# Patient Record
Sex: Male | Born: 1955 | Race: White | Hispanic: No | Marital: Single | Smoking: Former smoker
Health system: Southern US, Community
[De-identification: ages and names within clinical notes are randomized; demographics above are authoritative.]

## PROBLEM LIST (undated history)

## (undated) DIAGNOSIS — N529 Male erectile dysfunction, unspecified: Secondary | ICD-10-CM

## (undated) DIAGNOSIS — F32A Depression, unspecified: Secondary | ICD-10-CM

## (undated) DIAGNOSIS — F329 Major depressive disorder, single episode, unspecified: Secondary | ICD-10-CM

## (undated) DIAGNOSIS — E785 Hyperlipidemia, unspecified: Secondary | ICD-10-CM

## (undated) DIAGNOSIS — R0789 Other chest pain: Secondary | ICD-10-CM

## (undated) DIAGNOSIS — E119 Type 2 diabetes mellitus without complications: Secondary | ICD-10-CM

## (undated) DIAGNOSIS — K5790 Diverticulosis of intestine, part unspecified, without perforation or abscess without bleeding: Secondary | ICD-10-CM

## (undated) DIAGNOSIS — F419 Anxiety disorder, unspecified: Secondary | ICD-10-CM

## (undated) DIAGNOSIS — I10 Essential (primary) hypertension: Secondary | ICD-10-CM

## (undated) DIAGNOSIS — E669 Obesity, unspecified: Secondary | ICD-10-CM

## (undated) DIAGNOSIS — K219 Gastro-esophageal reflux disease without esophagitis: Secondary | ICD-10-CM

## (undated) DIAGNOSIS — R9431 Abnormal electrocardiogram [ECG] [EKG]: Secondary | ICD-10-CM

## (undated) HISTORY — DX: Essential (primary) hypertension: I10

## (undated) HISTORY — DX: Hyperlipidemia, unspecified: E78.5

## (undated) HISTORY — DX: Other chest pain: R07.89

## (undated) HISTORY — DX: Abnormal electrocardiogram (ECG) (EKG): R94.31

## (undated) HISTORY — DX: Obesity, unspecified: E66.9

## (undated) HISTORY — DX: Male erectile dysfunction, unspecified: N52.9

## (undated) HISTORY — DX: Type 2 diabetes mellitus without complications: E11.9

---

## 1999-03-11 ENCOUNTER — Emergency Department (HOSPITAL_COMMUNITY): Admission: EM | Admit: 1999-03-11 | Discharge: 1999-03-11 | Payer: Self-pay | Admitting: Emergency Medicine

## 2005-04-12 ENCOUNTER — Emergency Department (HOSPITAL_COMMUNITY): Admission: EM | Admit: 2005-04-12 | Discharge: 2005-04-12 | Payer: Self-pay | Admitting: Emergency Medicine

## 2005-07-21 HISTORY — PX: OTHER SURGICAL HISTORY: SHX169

## 2005-08-29 ENCOUNTER — Ambulatory Visit (HOSPITAL_COMMUNITY): Admission: RE | Admit: 2005-08-29 | Discharge: 2005-08-29 | Payer: Self-pay | Admitting: Family Medicine

## 2005-08-29 ENCOUNTER — Inpatient Hospital Stay (HOSPITAL_COMMUNITY): Admission: EM | Admit: 2005-08-29 | Discharge: 2005-09-02 | Payer: Self-pay | Admitting: Family Medicine

## 2007-11-01 ENCOUNTER — Emergency Department (HOSPITAL_COMMUNITY): Admission: EM | Admit: 2007-11-01 | Discharge: 2007-11-01 | Payer: Self-pay | Admitting: Emergency Medicine

## 2010-12-06 NOTE — Discharge Summary (Signed)
Shawn Yu, Shawn Yu                ACCOUNT NO.:  [id number]   MEDICAL RECORD NO.:  [id number]          PATIENT TYPE:  INP   LOCATION:  5739                         FACILITY:  [facility]   PHYSICIAN:  Wilmon Arms. Corliss Skains, M.D. DATE OF BIRTH:  Sep 06, 1955   DATE OF ADMISSION:  08/29/2005  DATE OF DISCHARGE:  09/02/2005                                 DISCHARGE SUMMARY   PRIMARY CARE PHYSICIAN:  None.   CHIEF COMPLAINT AND REASON FOR ADMISSION:  Shawn Yu is a 55 year old  patient with a positive family medical history of diverticulosis and  diverticulitis and also several family members have had bowel perforations  who presented for treatment with a complaint of one week's worth of feeling  weak and generalized malaise.  He developed severe lower abdominal pain the  Wednesday prior to admission.  He presented to the urgent care on the date  of admission where CT did reveal a proximal perforated diverticulum, small  air, no abscess.  His white count was 23,000.  He had a T-max on the day of  presentation of 101.1.  He was subsequently sent over to [facility] for admission.   ADMISSION DIAGNOSES:  1.  Perforated diverticulum.  2.  Leukocytosis.  3.  Fever.  4.  Known history of dyslipidemia.  5.  Known history of tobacco abuse.   HOSPITAL COURSE:  Patient was admitted to the general medical floor where he  was made NPO status, started on IV fluids, started empirically on Cipro and  Flagyl IV and he was monitored clinically.  By the following day, his white  count was found 18,000.  He had no fever.  He was passing gas.  His lungs  were clear.  Patient had moderate left lower quadrant tenderness and mild  left lower quadrant guarding.  By September 01, 2005, patient had been  tolerating full liquid diet, white count was down to 9800.  Abdomen exam was  benign.  Patient was advanced to a regular diet and Cipro and Flagyl were  changed to p.o.  By September 02, 2005,  patient was deemed appropriate for  discharge.  Patient has received a nutrition and health care teaching  regarding diverticulitis.   In addition, there was some question as to whether there was a possible  right renal cyst seen on CT.  Patient thinks he has been worked up for this  in the past.  May need to rediscuss this once the patient establishes with  PCP or per Dr. Dixon Boos discretion and follow-up.   FINAL DISCHARGE DIAGNOSES:  1.  Diverticulitis with perforated diverticulum.  2.  Leukocytosis, resolved.  3.  Fever, resolved.  4.  Dyslipidemia.  5.  Tobacco abuse.   DISCHARGE MEDICATIONS:  1.  Cipro 500 p.o. b.i.d. for the next six days.  2.  Flagyl 500 mg t.i.d. for the next six days.  3.  Effexor as before.  4.  Lipitor as before.  5.  Vicodin 5/500 one to two tablets  every six hours as needed for pain.  6.  Benadryl 25  or 50 mg over-the-counter at bedtime as needed to help      sleep.   DIET:  Heart healthy with diverticulosis precautions.   Return to work on September 09, 2005.  Patient does have the ability to work  out of the home and pending his discretion he may do this.   ACTIVITY:  Increase activity slowly.  No other restrictions.  No wound care.   FOLLOW UP:  He needs to call Dr. Dixon Boos office at [phone] to be seen in  the next two weeks.      Allison L. Rondel Jumbo. Tsuei, M.D.  Electronically Signed    ALE/MEDQ  D:  09/02/2005  T:  09/02/2005  Job:  [id number]

## 2010-12-06 NOTE — H&P (Signed)
Shawn Yu, Shawn Yu                ACCOUNT NO.:  [id number]   MEDICAL RECORD NO.:  [id number]          PATIENT TYPE:  INP   LOCATION:  5739                         FACILITY:  [facility]   PHYSICIAN:  Cherylynn Ridges, M.D.    DATE OF BIRTH:  03-15-1956   DATE OF ADMISSION:  08/29/2005  DATE OF DISCHARGE:  08/29/2005                                HISTORY & PHYSICAL   IDENTIFICATION/CHIEF COMPLAINT:  The patient is a 55 year old with acute  diverticulitis and micro perforation who is being admitted for IV  antibiotics.   HISTORY OF PRESENT ILLNESS:  The patient has been having worsening abdominal  pain for the last three days.  Actually, today it started to feel better as  he went to get a CT scan which showed acute diverticulitis with micro  perforation.  He has a family history of diverticular disease and  perforations but no personal problems with this before.  He comes in now for  IV antibiotics.  He was sent over from urgent care at [facility]  with the CT findings.   PAST MEDICAL HISTORY:  1.  Hypercholesterolemia/dyslipidemia.  2.  Anxiety disorder.   SOCIAL HISTORY:  He smoked five cigarettes a day and says he drinks a six-  pack of alcohol a day.   He has no known drug allergies.   He has had no previous surgery.   MEDICATIONS:  Include Lipitor and Effexor.   PHYSICAL EXAMINATION:  GENERAL:  He is well nourished, well developed  gentleman in no acute distress.  He says his abdomen is uncomfortable.  VITAL SIGNS:  He has had a fever up to 101.1.  It is currently 99.8.  His  pulse is 108, blood pressure 122/77.  HEENT:  He is normocephalic and atraumatic and anicteric.  NECK:  Supple.  No palpable adenopathy.  CHEST:  Clear to auscultation and percussion.  CARDIAC:  Regular rhythm and rate with no murmurs, gallops, rubs, or heaves.  ABDOMEN:  Soft without acute peritonitis.  He has hypoactive but present  bowel sounds.  No rebound or guarding.  No palpable masses.  RECTAL:  Not performed.   LABORATORY STUDIES:  Show a white count of 23.1 thousand, hemoglobin of 14,  hematocrit of 41.4, platelets of 281,000, and he does have a left shift.  Electrolytes are all within normal limits.   IMPRESSION:  Acute diverticulitis with micro perforation.   PLAN:  Treat him with IV antibiotics and hopefully he will get better  without need for acute operative intervention.  He will go to surgery if his  symptoms should worsen or his white count appears to be get worse.      Cherylynn Ridges, M.D.  Electronically Signed     JOW/MEDQ  D:  08/29/2005  T:  08/30/2005  Job:  [id number]

## 2012-11-17 ENCOUNTER — Other Ambulatory Visit (HOSPITAL_COMMUNITY): Payer: Self-pay | Admitting: Cardiovascular Disease

## 2012-11-17 DIAGNOSIS — R9431 Abnormal electrocardiogram [ECG] [EKG]: Secondary | ICD-10-CM

## 2012-11-17 DIAGNOSIS — R079 Chest pain, unspecified: Secondary | ICD-10-CM

## 2012-11-20 ENCOUNTER — Encounter: Payer: Self-pay | Admitting: *Deleted

## 2012-11-23 ENCOUNTER — Encounter: Payer: Self-pay | Admitting: Cardiovascular Disease

## 2012-12-02 ENCOUNTER — Encounter (HOSPITAL_COMMUNITY): Payer: Self-pay

## 2012-12-08 ENCOUNTER — Ambulatory Visit (HOSPITAL_COMMUNITY)
Admission: RE | Admit: 2012-12-08 | Discharge: 2012-12-08 | Disposition: A | Discharge status: Home / Self Care | Payer: BC Managed Care – PPO | Source: Ambulatory Visit | Attending: Cardiovascular Disease | Admitting: Cardiovascular Disease

## 2012-12-08 DIAGNOSIS — I10 Essential (primary) hypertension: Secondary | ICD-10-CM | POA: Insufficient documentation

## 2012-12-08 DIAGNOSIS — Z8249 Family history of ischemic heart disease and other diseases of the circulatory system: Secondary | ICD-10-CM | POA: Insufficient documentation

## 2012-12-08 DIAGNOSIS — Z87891 Personal history of nicotine dependence: Secondary | ICD-10-CM | POA: Insufficient documentation

## 2012-12-08 DIAGNOSIS — R9431 Abnormal electrocardiogram [ECG] [EKG]: Secondary | ICD-10-CM

## 2012-12-08 DIAGNOSIS — R079 Chest pain, unspecified: Secondary | ICD-10-CM | POA: Insufficient documentation

## 2012-12-08 DIAGNOSIS — E119 Type 2 diabetes mellitus without complications: Secondary | ICD-10-CM | POA: Insufficient documentation

## 2012-12-08 DIAGNOSIS — R0602 Shortness of breath: Secondary | ICD-10-CM | POA: Insufficient documentation

## 2012-12-08 MED ORDER — TECHNETIUM TC 99M SESTAMIBI GENERIC - CARDIOLITE
10.7000 | Freq: Once | INTRAVENOUS | Status: AC | PRN
  Administered 2012-12-08: 11 via INTRAVENOUS

## 2012-12-08 MED ORDER — TECHNETIUM TC 99M SESTAMIBI GENERIC - CARDIOLITE
30.2000 | Freq: Once | INTRAVENOUS | Status: AC | PRN
  Administered 2012-12-08: 30.2 via INTRAVENOUS

## 2012-12-08 NOTE — Procedures (Addendum)
[facility] [address] [place] [address] [phone]  Cardiology Nuclear Med Study  USBALDO PANNONE is a 57 y.o. male     MRN : [id number]     DOB: 1955-10-23  Procedure Date: 12/08/2012  Nuclear Med Background Indication for Stress Test:  Evaluation for Ischemia and Abnormal EKG History:  no prior history reported by pt. Cardiac Risk Factors: Family History - CAD, History of Smoking, Hypertension, Lipids, NIDDM and Obesity  Symptoms:  Chest Pain and SOB   Nuclear Pre-Procedure Caffeine/Decaff Intake:  1:00am NPO After: 11AM   IV Site: R Hand  IV 0.9% NS with Angio Cath:  22g  Chest Size (in):  46"  IV Started by: Emmit Pomfret, RN  Height: 5\' 10"  (1.778 m)  Cup Size: n/a  BMI:  Body mass index is 30.42 kg/(m^2). Weight:  212 lb (96.163 kg)   Tech Comments:  N/A    Nuclear Med Study 1 or 2 day study: 1 day  Stress Test Type:  Stress  Order Authorizing Provider:  Thurmon Fair, MD   Resting Radionuclide: Technetium 17m Sestamibi  Resting Radionuclide Dose: 10.7 mCi   Stress Radionuclide:  Technetium 19m Sestamibi  Stress Radionuclide Dose: 30.2 mCi           Stress Protocol Rest HR: 93 Stress HR: 151  Rest BP: 134/91 Stress BP: 186/92  Exercise Time (min): 8:32 METS: 10.1   Predicted Max HR: 164 bpm % Max HR: 92.07 bpm Rate Pressure Product: 13244  Dose of Adenosine (mg):  n/a Dose of Lexiscan: n/a mg  Dose of Atropine (mg): n/a Dose of Dobutamine: n/a mcg/kg/min (at max HR)  Stress Test Technologist: Esperanza Sheets, CCT Nuclear Technologist: Gonzella Lex, CNMT   Rest Procedure:  Myocardial perfusion imaging was performed at rest 45 minutes following the intravenous administration of Technetium 81m Sestamibi. Stress Procedure:  The patient performed treadmill exercise using a Bruce  Protocol for 8:32 minutes. The patient stopped due to SOB and Leg Fatigue and denied any chest pain.  There were no  significant ST-T wave changes.  Technetium 62m Sestamibi was injected at peak exercise and myocardial perfusion imaging was performed after a brief delay.  Transient Ischemic Dilatation (Normal <1.22):  1.16 Lung/Heart Ratio (Normal <0.45):  0.30 QGS EDV:  83 ml QGS ESV:  32 ml LV Ejection Fraction: 61%  Signed by  Gonzella Lex, CNMT  PHYSICIAN INTERPRETATION:  Rest ECG: NSR - Normal EKG  Stress ECG: Insignificant upsloping ST segment depression., There are scattered PVCs. and PVCs also in couplets; At the initial stage of recovery, there was a run of at least 8 beats of what looks like Non-sustained Ventricular Tachycardia  QPS Raw Data Images:  Mild diaphragmatic attenuation.  Normal left ventricular size. Stress Images:  There is decreased uptake in the inferior wall consistent with diaphragmatic attenuation.  Rest Images:  There is decreased uptake in the inferior wall. Subtraction (SDS):  There is a fixed inferior defect that is most consistent with diaphragmatic attenuation.  Impression Exercise Capacity:  Good exercise capacity. BP Response:  Normal blood pressure response.  Clinical Symptoms:  Fatigue and shortness of breath; no palpitations or chest pain ECG Impression:  No significant ST segment change suggestive of ischemia. Insignificant upsloping ST segment depression.There are scattered PVCs that occurred in singles and couplets followed by a brief run of Non-sustained Ventricular Tachycardia in early recovery. PVCs continued sporadically during recovery. Comparison with Prior Nuclear Study:  No images to compare  LV Wall Motion:  NL LV Function; NL Wall Motion  Overall Impression:  Normal stress nuclear study. and Low risk stress nuclear study with mild diaphragmatic attenuation.Marland Kitchen  Marykay Lex, MD  12/08/2012 6:56 PM

## 2012-12-16 ENCOUNTER — Ambulatory Visit: Payer: Self-pay | Admitting: Cardiovascular Disease

## 2012-12-23 ENCOUNTER — Ambulatory Visit (INDEPENDENT_AMBULATORY_CARE_PROVIDER_SITE_OTHER): Payer: BC Managed Care – PPO | Admitting: Cardiovascular Disease

## 2012-12-23 VITALS — BP 146/80 | HR 96 | Resp 20 | Ht 70.0 in | Wt 211.0 lb

## 2012-12-23 DIAGNOSIS — E119 Type 2 diabetes mellitus without complications: Secondary | ICD-10-CM

## 2012-12-23 DIAGNOSIS — I10 Essential (primary) hypertension: Secondary | ICD-10-CM

## 2012-12-23 DIAGNOSIS — N529 Male erectile dysfunction, unspecified: Secondary | ICD-10-CM

## 2012-12-23 DIAGNOSIS — E785 Hyperlipidemia, unspecified: Secondary | ICD-10-CM

## 2012-12-23 DIAGNOSIS — R0789 Other chest pain: Secondary | ICD-10-CM

## 2012-12-23 DIAGNOSIS — R9431 Abnormal electrocardiogram [ECG] [EKG]: Secondary | ICD-10-CM

## 2012-12-23 NOTE — Progress Notes (Signed)
Patient ID: Shawn Yu, male   DOB: [birth year], 57 y.o.   MRN: [id number]      Reason for office visit Followup nuclear stress test  Shawn Yu returns in followup after undergoing a stress myocardial perfusion study. He has numerous coronary risk factors (high cholesterol, high blood pressure, type 2 diabetes mellitus, former smoker, family history of premature death from CAD). He had atypical chest pain and his electrogram showed new inverted T waves in the inferior leads. Despite this, to exercise without symptoms for 8 minutes and 32 seconds on the standardized Bruce protocol. The blood pressure response was hypertensive. He did not experience angina and there were no ST segment changes. Most importantly the nuclear images were normal. A brief run of nonsustained VT was seen in early recovery.   No Known Allergies  Current Outpatient Prescriptions  Medication Sig Dispense Refill  . aspirin 81 MG tablet Take 81 mg by mouth daily.      Marland Kitchen atorvastatin (LIPITOR) 20 MG tablet Take 20 mg by mouth daily.      Marland Kitchen loratadine (CLARITIN) 10 MG tablet Take 10 mg by mouth daily.      Marland Kitchen losartan (COZAAR) 50 MG tablet Take 50 mg by mouth daily.      . metFORMIN (GLUCOPHAGE) 500 MG tablet Take 500 mg by mouth 3 (three) times daily.       Marland Kitchen omeprazole (PRILOSEC) 20 MG capsule Take 20 mg by mouth daily.      . tadalafil (CIALIS) 10 MG tablet Take 10 mg by mouth daily as needed for erectile dysfunction.      Marland Kitchen zolpidem (AMBIEN) 10 MG tablet Take 10 mg by mouth at bedtime as needed for sleep.      . fluticasone (FLONASE) 50 MCG/ACT nasal spray Place 2 sprays into the nose daily.       No current facility-administered medications for this visit.    Past Medical History  Diagnosis Date  . Chest pain, atypical   . SOB (shortness of breath)   . Abnormal EKG   . DM (diabetes mellitus)   . Obesity   . Erectile dysfunction   . Hypertension     No past surgical history on file.  Family History    Problem Relation Age of Onset  . Hypertension Mother   . Hypertension Mother   . Diabetes Father   . Heart failure Father   . Hypertension Brother   . Hyperlipidemia Brother   . Cancer Brother   . Hyperlipidemia Brother   . HIV Brother     History   Social History  . Marital Status: Single    Spouse Name: N/A    Number of Children: N/A  . Years of Education: N/A   Occupational History  . Not on file.   Social History Main Topics  . Smoking status: Former Smoker    Types: Cigarettes  . Smokeless tobacco: Not on file  . Alcohol Use: Yes     Comment: occas  . Drug Use: Not on file  . Sexually Active: Not on file   Other Topics Concern  . Not on file   Social History Narrative  . No narrative on file    Review of systems: The patient specifically denies any chest pain at rest or with exertion, dyspnea at rest or with exertion, orthopnea, paroxysmal nocturnal dyspnea, syncope, palpitations, focal neurological deficits, intermittent claudication, lower extremity edema, unexplained weight gain, cough, hemoptysis or wheezing.  The patient also denies  abdominal pain, nausea, vomiting, dysphagia, diarrhea, constipation, polyuria, polydipsia, dysuria, hematuria, frequency, urgency, abnormal bleeding or bruising, fever, chills, unexpected weight changes, mood swings, change in skin or hair texture, change in voice quality, auditory or visual problems, allergic reactions or rashes, new musculoskeletal complaints other than usual "aches and pains". He has erectile dysfunction   PHYSICAL EXAM BP 146/80  Pulse 96  Resp 20  Ht 5\' 10"  (1.778 m)  Wt 95.709 kg (211 lb)  BMI 30.28 kg/m2  General: Alert, oriented x3, no distress Head: no evidence of trauma, PERRL, EOMI, no exophtalmos or lid lag, no myxedema, no xanthelasma; normal ears, nose and oropharynx Neck: normal jugular venous pulsations and no hepatojugular reflux; brisk carotid pulses without delay and no carotid  bruits Chest: clear to auscultation, no signs of consolidation by percussion or palpation, normal fremitus, symmetrical and full respiratory excursions Cardiovascular: normal position and quality of the apical impulse, regular rhythm, normal first and second heart sounds, no murmurs, rubs or gallops Abdomen: no tenderness or distention, no masses by palpation, no abnormal pulsatility or arterial bruits, normal bowel sounds, no hepatosplenomegaly Extremities: no clubbing, cyanosis or edema; 2+ radial, ulnar and brachial pulses bilaterally; 2+ right femoral, posterior tibial and dorsalis pedis pulses; 2+ left femoral, posterior tibial and dorsalis pedis pulses; no subclavian or femoral bruits Neurological: grossly nonfocal   ASSESSMENT AND PLAN Atypical chest pain - normal nuclear stress test May 2014 The symptoms have abated and I'm not sure that have a good explanation for them. I think the anxiety related to the unexpected death of his brother (REPORTEDLY the autopsy showed evidence of coronary disease) has a lot to do with his concern about possible CAD.  DM2 (diabetes mellitus, type 2) I think this is clearly weight related and that he would lose just a few pounds, maybe reduce his waist size 234 inches, would likely not require medication for diabetes mellitus. His most recent hemoglobin A1c was 6.9%  On metformin monotherapy.  Essential hypertension Good control. An angiotensin receptor blocker is indeed a good choice since also has diabetes mellitus  Abnormal electrocardiogram Inverted T waves in leads 2, 3 and aVF noted on his electrocardiogram from April 2014 (and new from previous tracings) represent the major impetuous behind ordering his nuclear perfusion study. It is possible that they represent signs of left ventricular hypertrophy but there are no voltage criteria for LVH.  A repeat lipid profile is to be performed in August with his primary care  physician  Shawn Silk, MD, [facility] [phone] office [phone] pager

## 2012-12-23 NOTE — Patient Instructions (Addendum)
Your physician discussed the importance of regular exercise and recommended that you start or continue a regular exercise program for good health. Your physician recommends that you schedule a follow-up appointment in: 6 months.

## 2012-12-26 ENCOUNTER — Encounter: Payer: Self-pay | Admitting: Cardiovascular Disease

## 2012-12-26 DIAGNOSIS — I10 Essential (primary) hypertension: Secondary | ICD-10-CM | POA: Insufficient documentation

## 2012-12-26 DIAGNOSIS — R9431 Abnormal electrocardiogram [ECG] [EKG]: Secondary | ICD-10-CM | POA: Insufficient documentation

## 2012-12-26 DIAGNOSIS — R0789 Other chest pain: Secondary | ICD-10-CM | POA: Insufficient documentation

## 2012-12-26 DIAGNOSIS — E119 Type 2 diabetes mellitus without complications: Secondary | ICD-10-CM | POA: Insufficient documentation

## 2012-12-26 DIAGNOSIS — N529 Male erectile dysfunction, unspecified: Secondary | ICD-10-CM | POA: Insufficient documentation

## 2012-12-26 DIAGNOSIS — E785 Hyperlipidemia, unspecified: Secondary | ICD-10-CM | POA: Insufficient documentation

## 2012-12-26 NOTE — Assessment & Plan Note (Signed)
Inverted T waves in leads 2, 3 and aVF noted on his electrocardiogram from April 2014 (and new from previous tracings) represent the major impetuous behind ordering his nuclear perfusion study. It is possible that they represent signs of left ventricular hypertrophy but there are no voltage criteria for LVH.

## 2012-12-26 NOTE — Assessment & Plan Note (Signed)
The symptoms have abated and I'm not sure that have a good explanation for them. I think the anxiety related to the unexpected death of his brother (REPORTEDLY the autopsy showed evidence of coronary disease) has a lot to do with his concern about possible CAD.

## 2012-12-26 NOTE — Assessment & Plan Note (Signed)
I think this is clearly weight related and that he would lose just a few pounds, maybe reduce his waist size 234 inches, would likely not require medication for diabetes mellitus. His most recent hemoglobin A1c was 6.9%  On metformin monotherapy.

## 2012-12-26 NOTE — Assessment & Plan Note (Signed)
Good control. An angiotensin receptor blocker is indeed a good choice since also has diabetes mellitus

## 2013-04-28 ENCOUNTER — Emergency Department (INDEPENDENT_AMBULATORY_CARE_PROVIDER_SITE_OTHER): Payer: BC Managed Care – PPO

## 2013-04-28 ENCOUNTER — Emergency Department (HOSPITAL_COMMUNITY)
Admission: EM | Admit: 2013-04-28 | Discharge: 2013-04-28 | Disposition: A | Discharge status: Home / Self Care | Payer: BC Managed Care – PPO | Source: Home / Self Care | Attending: Emergency Medicine | Admitting: Emergency Medicine

## 2013-04-28 ENCOUNTER — Encounter (HOSPITAL_COMMUNITY): Payer: Self-pay | Admitting: Emergency Medicine

## 2013-04-28 DIAGNOSIS — S92919A Unspecified fracture of unspecified toe(s), initial encounter for closed fracture: Secondary | ICD-10-CM

## 2013-04-28 DIAGNOSIS — S92411A Displaced fracture of proximal phalanx of right great toe, initial encounter for closed fracture: Secondary | ICD-10-CM

## 2013-04-28 MED ORDER — OXYCODONE-ACETAMINOPHEN 5-325 MG PO TABS: 1.0000 | ORAL_TABLET | ORAL | Status: DC | PRN

## 2013-04-28 NOTE — ED Notes (Signed)
Running barefoot Tuesday and tripped but did not fall.  C/o pain, swelling and discoloration to 2nd and 3rd toes of R foot.  Great toe is painful red and swollen.

## 2013-04-28 NOTE — ED Notes (Signed)
Triage interrupted by PA giving instructions.

## 2013-04-28 NOTE — ED Provider Notes (Signed)
CSN: [id number]     Arrival date & time 04/28/13  1825 History   None    Chief Complaint  Patient presents with  . Foot Injury   (Consider location/radiation/quality/duration/timing/severity/associated sxs/prior Treatment) HPI Comments: 57 year old male presents complaining of right foot pain. He was running for exercise without shoes on when he stubbed his foot into something. He has severe pain and had developed swelling by the next day. The pain is primarily in the great toe. The pain is not so bad when he first wakes up, but by the end of the day the pain is fairly unbearable. He says the pain is severe enough to make him feel sick to his stomach. Denies any other injury or numbness in the toe. No history of fractures. He has been icing it and applying heat with some relief of his symptoms   Past Medical History  Diagnosis Date  . Chest pain, atypical   . SOB (shortness of breath)   . Abnormal EKG   . DM (diabetes mellitus)   . Obesity   . Erectile dysfunction   . Hypertension    History reviewed. No pertinent past surgical history. Family History  Problem Relation Age of Onset  . Hypertension Mother   . Hypertension Mother   . Diabetes Father   . Heart failure Father   . Hypertension Brother   . Hyperlipidemia Brother   . Cancer Brother   . Hyperlipidemia Brother   . HIV Brother    History  Substance Use Topics  . Smoking status: Former Smoker    Types: Cigarettes    Quit date: 07/21/2006  . Smokeless tobacco: Not on file  . Alcohol Use: No    Review of Systems  Constitutional: Negative for fever, chills and fatigue.  HENT: Negative for sore throat.   Eyes: Negative for visual disturbance.  Respiratory: Negative for cough and shortness of breath.   Cardiovascular: Negative for chest pain, palpitations and leg swelling.  Gastrointestinal: Negative for nausea, vomiting, abdominal pain, diarrhea and constipation.  Genitourinary: Negative for dysuria, urgency,  frequency and hematuria.  Musculoskeletal: Positive for arthralgias and joint swelling.       See HPI  Skin: Negative for rash.  Neurological: Negative for dizziness, weakness and light-headedness.    Allergies  Review of patient's allergies indicates no known allergies.  Home Medications   Current Outpatient Rx  Name  Route  Sig  Dispense  Refill  . aspirin 81 MG tablet   Oral   Take 81 mg by mouth daily.         Marland Kitchen atorvastatin (LIPITOR) 20 MG tablet   Oral   Take 20 mg by mouth daily.         Marland Kitchen loratadine (CLARITIN) 10 MG tablet   Oral   Take 10 mg by mouth daily.         Marland Kitchen losartan (COZAAR) 50 MG tablet   Oral   Take 50 mg by mouth daily.         . metFORMIN (GLUCOPHAGE) 500 MG tablet   Oral   Take 500 mg by mouth 3 (three) times daily.          Marland Kitchen omeprazole (PRILOSEC) 20 MG capsule   Oral   Take 20 mg by mouth daily.         . tadalafil (CIALIS) 10 MG tablet   Oral   Take 10 mg by mouth daily as needed for erectile dysfunction.         Marland Kitchen  zolpidem (AMBIEN) 10 MG tablet   Oral   Take 10 mg by mouth at bedtime as needed for sleep.         . fluticasone (FLONASE) 50 MCG/ACT nasal spray   Nasal   Place 2 sprays into the nose daily.         Marland Kitchen oxyCODONE-acetaminophen (ROXICET) 5-325 MG per tablet   Oral   Take 1 tablet by mouth every 4 (four) hours as needed for pain.   20 tablet   0    BP 159/95  Pulse 84  Temp(Src) 98.6 F (37 C) (Oral)  Resp 18  SpO2 99% Physical Exam  Nursing note and vitals reviewed. Constitutional: He is oriented to person, place, and time. He appears well-developed and well-nourished. No distress.  HENT:  Head: Normocephalic.  Pulmonary/Chest: Effort normal. No respiratory distress.  Musculoskeletal:       Feet:  Neurological: He is alert and oriented to person, place, and time. Coordination normal.  Skin: Skin is warm and dry. No rash noted. He is not diaphoretic.  Psychiatric: He has a normal mood and  affect. Judgment normal.    ED Course  Procedures (including critical care time) Labs Review Labs Reviewed - No data to display Imaging Review Dg Foot Complete Right  04/28/2013   CLINICAL DATA:  Right foot pain and swelling following a fall.  EXAM: RIGHT FOOT COMPLETE - 3+ VIEW  COMPARISON:  None.  FINDINGS: Comminuted fracture of the 1st proximal phalanx extending into the medial aspect of the 1st IP joint. Minimal lateral displacement and mild dorsal angulation of the distal fragment. Mild at lateral spur formation on both sides of the 1st MTP joint. Mild posterior calcaneal spur formation.  IMPRESSION: 1. Comminuted 1st proximal phalanx fracture with extension into the medial aspect of the 1st IP joint. 2. Mild 1st MTP joint degenerative changes.   Electronically Signed   By: Gordan Payment M.D.   On: 04/28/2013 19:55      MDM   1. Closed displaced fracture of proximal phalanx of great toe, right, initial encounter    Discussed this with the orthopedic surgeon on call. Placing in a postop shoe. Discharge with pain control. Ice, elevation. Followup with orthopedics within one week.   Meds ordered this encounter  Medications  . oxyCODONE-acetaminophen (ROXICET) 5-325 MG per tablet    Sig: Take 1 tablet by mouth every 4 (four) hours as needed for pain.    Dispense:  20 tablet    Refill:  0    Order Specific Question:  Supervising Provider    Answer:  Lorenz Coaster, DAVID C [[id number]]       Graylon Good, PA-C 04/28/13 2055

## 2013-04-28 NOTE — ED Provider Notes (Signed)
Medical screening examination/treatment/procedure(s) were performed by non-physician practitioner and as supervising physician I was immediately available for consultation/collaboration.  Leslee Home, M.D.  Reuben Likes, MD 04/28/13 2120

## 2013-05-23 ENCOUNTER — Telehealth: Payer: Self-pay | Admitting: Internal Medicine

## 2013-05-23 NOTE — Telephone Encounter (Signed)
Left pt vm to return call in ref to np appt. °

## 2013-05-23 NOTE — Telephone Encounter (Signed)
S/W PT IN REF TO NP APPT. ON 06/01/13@1 :30 REFERRING DR HUSIAN DX-ELEVATED WBC MAILED NP PACKET

## 2013-05-23 NOTE — Telephone Encounter (Signed)
C/D 05/23/13 for appt. 06/01/13

## 2013-06-01 ENCOUNTER — Ambulatory Visit: Payer: BC Managed Care – PPO

## 2013-06-01 ENCOUNTER — Ambulatory Visit (HOSPITAL_BASED_OUTPATIENT_CLINIC_OR_DEPARTMENT_OTHER): Payer: BC Managed Care – PPO | Admitting: Internal Medicine

## 2013-06-01 ENCOUNTER — Other Ambulatory Visit (HOSPITAL_BASED_OUTPATIENT_CLINIC_OR_DEPARTMENT_OTHER): Payer: BC Managed Care – PPO | Admitting: Lab

## 2013-06-01 ENCOUNTER — Encounter: Payer: Self-pay | Admitting: Internal Medicine

## 2013-06-01 ENCOUNTER — Other Ambulatory Visit: Payer: Self-pay | Admitting: Internal Medicine

## 2013-06-01 VITALS — BP 138/88 | HR 82 | Temp 98.5°F | Resp 22 | Ht 70.0 in | Wt 206.5 lb

## 2013-06-01 DIAGNOSIS — D72829 Elevated white blood cell count, unspecified: Secondary | ICD-10-CM

## 2013-06-01 LAB — COMPREHENSIVE METABOLIC PANEL (CC13)
ALT: 34 U/L (ref 0–55)
Alkaline Phosphatase: 86 U/L (ref 40–150)
BUN: 16.3 mg/dL (ref 7.0–26.0)
Calcium: 9.4 mg/dL (ref 8.4–10.4)
Creatinine: 0.8 mg/dL (ref 0.7–1.3)
Glucose: 138 mg/dl (ref 70–140)
Potassium: 4.3 mEq/L (ref 3.5–5.1)
Sodium: 139 mEq/L (ref 136–145)
Total Protein: 7.5 g/dL (ref 6.4–8.3)

## 2013-06-01 LAB — CBC WITH DIFFERENTIAL/PLATELET
BASO%: 0.9 % (ref 0.0–2.0)
Basophils Absolute: 0.1 10*3/uL (ref 0.0–0.1)
HCT: 41.8 % (ref 38.4–49.9)
MONO#: 0.8 10*3/uL (ref 0.1–0.9)
MONO%: 7.8 % (ref 0.0–14.0)
NEUT#: 6.5 10*3/uL (ref 1.5–6.5)
RBC: 4.59 10*6/uL (ref 4.20–5.82)
WBC: 9.7 10*3/uL (ref 4.0–10.3)

## 2013-06-01 NOTE — Progress Notes (Signed)
Checked in new pt with no financial concerns. °

## 2013-06-01 NOTE — Patient Instructions (Signed)
Your leukocyte count is normal today. Followup visit with your primary care physician as previously scheduled.

## 2013-06-01 NOTE — Progress Notes (Signed)
[facility] [phone]   [phone]  CONSULT NOTE  REFERRING PHYSICIAN: Dr. Georgann Housekeeper  REASON FOR CONSULTATION:  57 years old white male with leukocytosis  HPI Shawn Yu is a 57 y.o. male with a past medical history significant for hypertension, dyslipidemia, depression, diverticulosis with one episode of diverticulitis 7 years ago, diabetes mellitus, GERD and chronic back pain. The patient was seen recently by his primary care physician Dr. Donette Larry and CBC performed on 04/19/2013 showed elevated white blood count of 12.9, with normal hemoglobin of 14.1 and hematocrit of 42.6% and normal platelets count of 312,000. Repeat CBC on 05/19/2013 showed persistent elevation of the white blood count to 13.6 with elevated absolute neutrophil count to 9700 and monocytes to 1000.  The uterus and can be referred the patient to me today for further evaluation and recommendation regarding his persistent leukocytosis. The patient denied having any recent infection except for infection a few weeks ago and broken toe. He denied having any recent use of steroids or hormonal treatment.  He intentionally lost around 10 pounds recently and has occasional night sweats. The patient also has problem with Ragweed allergy and this usually associated with increasing aches and pains. He denied having any significant chest pain, shortness breath, cough or hemoptysis. The patient denied having any nausea or vomiting. He denied having any palpable lymphadenopathy. He denied having any fever or chills. Family history significant for a father who is deceased at age 45 5 with coronary artery disease. Mother is still alive at age 26 and brother had prostate cancer. The patient is single and has no children. 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 He denied having any history of smoking but drinks alcohol occasionally and no history of drug abuse. HPI  Past  Medical History  Diagnosis Date  . Chest pain, atypical   . SOB (shortness of breath)   . Abnormal EKG   . DM (diabetes mellitus)   . Obesity   . Erectile dysfunction   . Hypertension     History reviewed. No pertinent past surgical history.  Family History  Problem Relation Age of Onset  . Hypertension Mother   . Hypertension Mother   . Diabetes Father   . Heart failure Father   . Hypertension Brother   . Hyperlipidemia Brother   . Cancer Brother   . Hyperlipidemia Brother   . HIV Brother     Social History History  Substance Use Topics  . Smoking status: Former Smoker    Types: Cigarettes    Quit date: 07/21/2006  . Smokeless tobacco: Not on file  . Alcohol Use: No    No Known Allergies  Current Outpatient Prescriptions  Medication Sig Dispense Refill  . aspirin 81 MG tablet Take 81 mg by mouth daily.      Marland Kitchen atorvastatin (LIPITOR) 20 MG tablet Take 20 mg by mouth daily.      . fluticasone (FLONASE) 50 MCG/ACT nasal spray Place 2 sprays into the nose daily.      Marland Kitchen loratadine (CLARITIN) 10 MG tablet Take 10 mg by mouth daily.      Marland Kitchen losartan (COZAAR) 50 MG tablet Take 50 mg by mouth daily.      . metFORMIN (GLUCOPHAGE) 500 MG tablet Take 500 mg by mouth 3 (three) times daily.       Marland Kitchen omeprazole (PRILOSEC) 20 MG capsule Take 20 mg by mouth daily.      Marland Kitchen  oxyCODONE-acetaminophen (ROXICET) 5-325 MG per tablet Take 1 tablet by mouth every 4 (four) hours as needed for pain.  20 tablet  0  . tadalafil (CIALIS) 10 MG tablet Take 10 mg by mouth daily as needed for erectile dysfunction.      Marland Kitchen zolpidem (AMBIEN) 10 MG tablet Take 10 mg by mouth at bedtime as needed for sleep.       No current facility-administered medications for this visit.    Review of Systems  Constitutional: negative Eyes: negative Ears, nose, mouth, throat, and face: negative Respiratory: negative Cardiovascular: negative Gastrointestinal: negative Genitourinary:negative Integument/breast:  negative Hematologic/lymphatic: negative Musculoskeletal:negative Neurological: negative Behavioral/Psych: negative Endocrine: negative Allergic/Immunologic: negative  Physical Exam  FAO:ZHYQM, healthy, no distress, well nourished, well developed and anxious SKIN: skin color, texture, turgor are normal, no rashes or significant lesions HEAD: Normocephalic, No masses, lesions, tenderness or abnormalities EYES: normal, PERRLA EARS: External ears normal, Canals clear OROPHARYNX:no exudate, no erythema and lips, buccal mucosa, and tongue normal  NECK: supple, no adenopathy, no JVD LYMPH:  no palpable lymphadenopathy, no hepatosplenomegaly LUNGS: clear to auscultation , and palpation HEART: regular rate & rhythm, no murmurs and no gallops ABDOMEN:abdomen soft, non-tender, normal bowel sounds and no masses or organomegaly BACK: Back symmetric, no curvature., No CVA tenderness EXTREMITIES:no joint deformities, effusion, or inflammation, no edema, no skin discoloration  NEURO: alert & oriented x 3 with fluent speech, no focal motor/sensory deficits  PERFORMANCE STATUS: ECOG 0  LABORATORY DATA: Lab Results  Component Value Date   WBC 9.7 06/01/2013   HGB 13.9 06/01/2013   HCT 41.8 06/01/2013   MCV 91.1 06/01/2013   PLT 338 06/01/2013      Chemistry      Component Value Date/Time   NA 139 06/01/2013 1341   K 4.3 06/01/2013 1341   CO2 25 06/01/2013 1341   BUN 16.3 06/01/2013 1341   CREATININE 0.8 06/01/2013 1341      Component Value Date/Time   CALCIUM 9.4 06/01/2013 1341   ALKPHOS 86 06/01/2013 1341   AST 21 06/01/2013 1341   ALT 34 06/01/2013 1341   BILITOT 0.34 06/01/2013 1341       RADIOGRAPHIC STUDIES: No results found.  ASSESSMENT: This is a very pleasant 57 years old white male who presented for evaluation of leukocytosis which is completely resolved at this point and most likely was reactive in nature secondary to infection and chronic sinusitis.  PLAN: I  have a lengthy discussion with the patient today about his condition. I discussed the lab result today with him and I recommended for him to continue routine observation and followup with his primary care physician. I don't see a need for any further investigation of his condition at this point unless it is persistent leukocytosis. I will see the patient on as needed basis at this point. He knows to contact me if he has any concerning hematologic issues.  The patient voices understanding of current disease status and treatment options and is in agreement with the current care plan.  All questions were answered. The patient knows to call the clinic with any problems, questions or concerns. We can certainly see the patient much sooner if necessary.  Thank you so much for allowing me to participate in the care of Shawn Yu. I will continue to follow up the patient with you and assist in his care.  I spent 40 minutes counseling the patient face to face. The total time spent in the appointment was 55 minutes.  Shawn Yu K. 06/01/2013, 2:50 PM

## 2013-06-30 ENCOUNTER — Encounter: Payer: Self-pay | Admitting: Internal Medicine

## 2013-11-30 ENCOUNTER — Ambulatory Visit: Payer: BC Managed Care – PPO | Admitting: Dietician

## 2014-02-23 ENCOUNTER — Encounter: Payer: BC Managed Care – PPO | Attending: Nurse Practitioner

## 2014-02-23 VITALS — Ht 70.0 in | Wt 211.0 lb

## 2014-02-23 DIAGNOSIS — Z713 Dietary counseling and surveillance: Secondary | ICD-10-CM | POA: Insufficient documentation

## 2014-02-23 DIAGNOSIS — E119 Type 2 diabetes mellitus without complications: Secondary | ICD-10-CM | POA: Diagnosis present

## 2014-02-23 NOTE — Progress Notes (Signed)
Patient was seen on 02/23/2014 for the first of a series of three diabetes self-management courses at [facility].  Patient Education Plan per assessed needs and concerns is to attend four course education program for Diabetes Self Management Education.  Current HbA1c: 7.2%  The following learning objectives were met by the patient during this class:  Describe diabetes  State some common risk factors for diabetes  Defines the role of glucose and insulin  Identifies type of diabetes and pathophysiology  Describe the relationship between diabetes and cardiovascular risk  State the members of the Healthcare Team  States the rationale for glucose monitoring  State when to test glucose  State their individual Target Range  State the importance of logging glucose readings  Describe how to interpret glucose readings  Identifies A1C target  Explain the correlation between A1c and eAG values  State symptoms and treatment of high blood glucose  State symptoms and treatment of low blood glucose  Explain proper technique for glucose testing  Identifies proper sharps disposal  Handouts given during class include:  Living Well with Diabetes book  Carb Counting and Meal Planning book  Meal Plan Card  Carbohydrate guide  Meal planning worksheet  Low Sodium Flavoring Tips  The diabetes portion plate  W3J to eAG Conversion Chart  Diabetes Medications  Diabetes Recommended Care Schedule  Support Group  Diabetes Success Plan  Core Class Satisfaction Survey  Follow-Up Plan:  Attend core 2

## 2014-03-02 DIAGNOSIS — E119 Type 2 diabetes mellitus without complications: Secondary | ICD-10-CM | POA: Diagnosis not present

## 2014-03-02 NOTE — Progress Notes (Signed)

## 2014-03-16 DIAGNOSIS — E119 Type 2 diabetes mellitus without complications: Secondary | ICD-10-CM

## 2014-03-20 NOTE — Progress Notes (Signed)
Patient was seen on 03/16/14 for the third of a series of three diabetes self-management courses at the [facility]. The following learning objectives were met by the patient during this class:    State the amount of activity recommended for healthy living   Describe activities suitable for individual needs   Identify ways to regularly incorporate activity into daily life   Identify barriers to activity and ways to over come these barriers  Identify diabetes medications being personally used and their primary action for lowering glucose and possible side effects   Describe role of stress on blood glucose and develop strategies to address psychosocial issues   Identify diabetes complications and ways to prevent them  Explain how to manage diabetes during illness   Evaluate success in meeting personal goal   Establish 2-3 goals that they will plan to diligently work on until they return for the  18-monthfollow-up visit  Goals:  Follow Diabetes Meal Plan as instructed  Aim for 15-30 mins of physical activity daily as tolerated  Bring food record and glucose log to your follow up visit  Your patient has established the following 4 month goals in their individualized success plan: I will count my carb choices at most meals and snacks I will increase my activity level at least 3 days a week I will take my diabetes medications as scheduled I will test my glucose at least 2-3 times a day, 7 days a week I will look at patterns in my record book at least daily To help manage stress I will exercise at least 3 times a week   Your patient has identified these potential barriers to change:  motivation  stress  Lack of family support  Your patient has identified their diabetes self-care support plan as  [facility]  On-Line resources  Plan:  Attend Core 4 in 4 months

## 2015-07-09 ENCOUNTER — Telehealth: Payer: Self-pay | Admitting: Internal Medicine

## 2015-07-09 NOTE — Telephone Encounter (Signed)
Patient referred by Dr. Lysle Rubens. Referral taken to desk nurse by HIM due to patient established. Last visit November 2014. Per desk nurse scheduled patient for f/u 2-3 weeks. Called patient but was not able to reach him due to he is at work. Gave relative appointment for 07/31/15 with MM. Schedule mailed.

## 2015-07-31 ENCOUNTER — Telehealth: Payer: Self-pay | Admitting: Internal Medicine

## 2015-07-31 ENCOUNTER — Encounter: Payer: Self-pay | Admitting: Internal Medicine

## 2015-07-31 ENCOUNTER — Ambulatory Visit (HOSPITAL_BASED_OUTPATIENT_CLINIC_OR_DEPARTMENT_OTHER): Payer: 59 | Admitting: Internal Medicine

## 2015-07-31 ENCOUNTER — Ambulatory Visit: Payer: Self-pay

## 2015-07-31 VITALS — BP 127/72 | HR 89 | Temp 98.3°F | Resp 17 | Ht 70.0 in | Wt 205.2 lb

## 2015-07-31 DIAGNOSIS — D72829 Elevated white blood cell count, unspecified: Secondary | ICD-10-CM | POA: Diagnosis not present

## 2015-07-31 LAB — COMPREHENSIVE METABOLIC PANEL
ALT: 25 U/L (ref 0–55)
AST: 18 U/L (ref 5–34)
Albumin: 4.2 g/dL (ref 3.5–5.0)
Alkaline Phosphatase: 73 U/L (ref 40–150)
Anion Gap: 10 mEq/L (ref 3–11)
BILIRUBIN TOTAL: 0.44 mg/dL (ref 0.20–1.20)
BUN: 23.1 mg/dL (ref 7.0–26.0)
CHLORIDE: 104 meq/L (ref 98–109)
CO2: 28 meq/L (ref 22–29)
CREATININE: 1 mg/dL (ref 0.7–1.3)
Calcium: 9.6 mg/dL (ref 8.4–10.4)
EGFR: 85 mL/min/{1.73_m2} — AB (ref >90)
GLUCOSE: 109 mg/dL (ref 70–140)
Potassium: 4.3 mEq/L (ref 3.5–5.1)
SODIUM: 142 meq/L (ref 136–145)
TOTAL PROTEIN: 7.1 g/dL (ref 6.4–8.3)

## 2015-07-31 LAB — CBC WITH DIFFERENTIAL/PLATELET
BASO%: 0.7 % (ref 0.0–2.0)
Basophils Absolute: 0.1 10*3/uL (ref 0.0–0.1)
EOS%: 1.1 % (ref 0.0–7.0)
Eosinophils Absolute: 0.2 10*3/uL (ref 0.0–0.5)
HCT: 38.9 % (ref 38.4–49.9)
HGB: 12.8 g/dL — ABNORMAL LOW (ref 13.0–17.1)
LYMPH%: 13.8 % — AB (ref 14.0–49.0)
MCH: 30.1 pg (ref 27.2–33.4)
MCHC: 32.8 g/dL (ref 32.0–36.0)
MCV: 91.5 fL (ref 79.3–98.0)
MONO#: 1.3 10*3/uL — AB (ref 0.1–0.9)
MONO%: 8 % (ref 0.0–14.0)
NEUT%: 76.4 % — AB (ref 39.0–75.0)
NEUTROS ABS: 12.8 10*3/uL — AB (ref 1.5–6.5)
Platelets: 329 10*3/uL (ref 140–400)
RBC: 4.25 10*6/uL (ref 4.20–5.82)
RDW: 13.8 % (ref 11.0–14.6)
WBC: 16.7 10*3/uL — AB (ref 4.0–10.3)
lymph#: 2.3 10*3/uL (ref 0.9–3.3)

## 2015-07-31 LAB — LACTATE DEHYDROGENASE: LDH: 176 U/L (ref 125–245)

## 2015-07-31 NOTE — Progress Notes (Signed)
[facility] [phone]   Fax:(336) [address] [place]  [phone]  DIAGNOSIS: Persistent leukocytosis.  PRIOR THERAPY: None  CURRENT THERAPY: Observation  INTERVAL HISTORY: Shawn Yu 60 y.o. male returns to the clinic today for follow-up visit based on the request of his primary care physician. The patient was seen more than 2 years ago for evaluation of his similar condition and repeat CBC at that time showed no concerning finding in his absolute neutrophil count were within the normal range. He was seen recently by his primary care physician for routine evaluation in his total white blood count was elevated on 2 separate occasions. He was referred back to me today for evaluation and recommendation regarding his condition. The patient mentions that he has a history of diverticulitis in the past and he also has been sick a few times over the last few months. He is not currently on any steroids or hormonal therapy. Teague or weakness. He denied having any chest pain, shortness breath, cough or hemoptysis. He has no weight loss or night sweats. He denied having any significant bleeding, bruises or ecchymosis.  MEDICAL HISTORY: Past Medical History  Diagnosis Date  . Chest pain, atypical   . SOB (shortness of breath)   . Abnormal EKG   . DM (diabetes mellitus) ([place])   . Obesity   . Erectile dysfunction   . Hypertension   . Hyperlipidemia     ALLERGIES:  has No Known Allergies.  MEDICATIONS:  Current Outpatient Prescriptions  Medication Sig Dispense Refill  . amlodipine-atorvastatin (CADUET) 10-20 MG tablet TK 1 T PO ONCE D  5  . aspirin 81 MG tablet Take 81 mg by mouth daily.    Marland Kitchen atorvastatin (LIPITOR) 20 MG tablet Take 20 mg by mouth daily.    . busPIRone (BUSPAR) 10 MG tablet TK 1 T PO BID  2  . estazolam (PROSOM) 2 MG tablet TK 1 T PO HS PRN  2  . FLUoxetine HCl 60 MG  TABS TK 1 T PO D  0  . fluticasone (FLONASE) 50 MCG/ACT nasal spray Place 2 sprays into the nose daily.    Marland Kitchen loratadine (CLARITIN) 10 MG tablet Take 10 mg by mouth daily.    Marland Kitchen losartan (COZAAR) 50 MG tablet Take 50 mg by mouth daily.    . metFORMIN (GLUCOPHAGE) 500 MG tablet Take 500 mg by mouth 3 (three) times daily.     Marland Kitchen omeprazole (PRILOSEC) 20 MG capsule Take 20 mg by mouth daily.    . ONE TOUCH ULTRA TEST test strip CHECK BLOOD SUGAR 3 TIMES A DAY.  0  . ONGLYZA 5 MG TABS tablet TK 1 T PO QD  11  . zolpidem (AMBIEN) 10 MG tablet Take 10 mg by mouth at bedtime as needed for sleep.     No current facility-administered medications for this visit.    SURGICAL HISTORY: History reviewed. No pertinent past surgical history.  REVIEW OF SYSTEMS:  A comprehensive review of systems was negative.   PHYSICAL EXAMINATION: General appearance: alert, cooperative and no distress Head: Normocephalic, without obvious abnormality, atraumatic Neck: no adenopathy, no JVD, supple, symmetrical, trachea midline and thyroid not enlarged, symmetric, no tenderness/mass/nodules Lymph nodes: Cervical, supraclavicular, and axillary nodes normal. Resp: clear to auscultation bilaterally Back: symmetric, no curvature. ROM normal. No CVA tenderness. Cardio: regular rate and rhythm, S1, S2 normal, no murmur, click, rub  or gallop GI: soft, non-tender; bowel sounds normal; no masses,  no organomegaly Extremities: extremities normal, atraumatic, no cyanosis or edema  ECOG PERFORMANCE STATUS: 0 - Asymptomatic  Blood pressure 127/72, pulse 89, temperature 98.3 F (36.8 C), temperature source Oral, resp. rate 17, height 5\' 10"  (1.778 m), weight 205 lb 3.2 oz (93.078 kg), SpO2 99 %.  LABORATORY DATA: Lab Results  Component Value Date   WBC 9.7 06/01/2013   HGB 13.9 06/01/2013   HCT 41.8 06/01/2013   MCV 91.1 06/01/2013   PLT 338 06/01/2013      Chemistry      Component Value Date/Time   NA 139 06/01/2013  1341   K 4.3 06/01/2013 1341   CO2 25 06/01/2013 1341   BUN 16.3 06/01/2013 1341   CREATININE 0.8 06/01/2013 1341      Component Value Date/Time   CALCIUM 9.4 06/01/2013 1341   ALKPHOS 86 06/01/2013 1341   AST 21 06/01/2013 1341   ALT 34 06/01/2013 1341   BILITOT 0.34 06/01/2013 1341       RADIOGRAPHIC STUDIES: No results found.  ASSESSMENT AND PLAN: This is a very pleasant 60 years old white male presented today for evaluation of persistent leukocytosis. I had a lengthy discussion with the patient today about his condition. He has several recent blood work that showed persistent leukocytosis. I will repeat his CBC, comprehensive metabolic panel and LDH today. I will also order molecular study for BCR/ABL to rule out any myeloproliferative disorder. If there is any concerning findings on his recent blood work I will call the patient for follow-up visit otherwise he will continue routine follow-up visit with his primary care physician at this point as this may be reactive in nature. The patient was advised to call immediately if he has any concerning symptoms in the interval.  The patient voices understanding of current disease status and treatment options and is in agreement with the current care plan.  All questions were answered. The patient knows to call the clinic with any problems, questions or concerns. We can certainly see the patient much sooner if necessary.  Disclaimer: This note was dictated with voice recognition software. Similar sounding words can inadvertently be transcribed and may not be corrected upon review.

## 2015-07-31 NOTE — Telephone Encounter (Signed)
Patient sent back to lab no f/u information at this time.

## 2015-08-06 ENCOUNTER — Telehealth: Payer: Self-pay | Admitting: Medical Oncology

## 2015-08-06 NOTE — Telephone Encounter (Signed)
Pt notified that per [facility] his " lab is ok and he can f/u with PCP or me in 3 months". Pt stated he will f/u with PCP.

## 2015-12-13 ENCOUNTER — Other Ambulatory Visit: Payer: Self-pay | Admitting: Gastroenterology

## 2016-01-11 ENCOUNTER — Encounter (HOSPITAL_COMMUNITY): Payer: Self-pay | Admitting: *Deleted

## 2016-01-20 ENCOUNTER — Encounter (HOSPITAL_COMMUNITY): Payer: Self-pay | Admitting: Certified Registered Nurse Anesthetist

## 2016-01-21 ENCOUNTER — Ambulatory Visit (HOSPITAL_COMMUNITY)
Admission: RE | Admit: 2016-01-21 | Discharge: 2016-01-21 | Disposition: A | Discharge status: Home / Self Care | Payer: 59 | Source: Ambulatory Visit | Attending: Gastroenterology | Admitting: Gastroenterology

## 2016-01-21 ENCOUNTER — Encounter (HOSPITAL_COMMUNITY): Payer: Self-pay | Admitting: *Deleted

## 2016-01-21 ENCOUNTER — Encounter (HOSPITAL_COMMUNITY)
Admission: RE | Disposition: A | Discharge status: Home / Self Care | Payer: Self-pay | Source: Ambulatory Visit | Attending: Gastroenterology

## 2016-01-21 DIAGNOSIS — Z539 Procedure and treatment not carried out, unspecified reason: Secondary | ICD-10-CM | POA: Diagnosis not present

## 2016-01-21 DIAGNOSIS — Z1211 Encounter for screening for malignant neoplasm of colon: Secondary | ICD-10-CM | POA: Insufficient documentation

## 2016-01-21 HISTORY — DX: Anxiety disorder, unspecified: F41.9

## 2016-01-21 HISTORY — DX: Depression, unspecified: F32.A

## 2016-01-21 HISTORY — DX: Major depressive disorder, single episode, unspecified: F32.9

## 2016-01-21 SURGERY — CANCELLED PROCEDURE
Anesthesia: Monitor Anesthesia Care

## 2016-01-21 MED ORDER — SODIUM CHLORIDE 0.9 % IV SOLN: INTRAVENOUS | Status: DC

## 2016-01-21 MED ORDER — FENTANYL CITRATE (PF) 100 MCG/2ML IJ SOLN: 25.0000 ug | INTRAMUSCULAR | Status: DC | PRN

## 2016-01-21 MED ORDER — LACTATED RINGERS IV SOLN: INTRAVENOUS | Status: DC

## 2016-01-21 SURGICAL SUPPLY — 21 items

## 2016-01-21 NOTE — Progress Notes (Signed)
During pre-procedure, pt stated he ate solid food yesterday at lunch.  Dr. Wynetta Emery ordered for procedure to be cancelled and to be rescheduled for later date.  Pt instructed to call office to reschedule colonoscopy.  Vista Lawman, RN

## 2016-01-21 NOTE — Anesthesia Preprocedure Evaluation (Deleted)
Anesthesia Evaluation  Patient identified by MRN, date of birth, ID band Patient awake    Reviewed: Allergy & Precautions, H&P , NPO status , Patient's Chart, lab work & pertinent test results  Airway Mallampati: II  TM Distance: >3 FB Neck ROM: full    Dental no notable dental hx. (+) Dental Advisory Given, Teeth Intact   Pulmonary neg pulmonary ROS, former smoker,    Pulmonary exam normal breath sounds clear to auscultation       Cardiovascular Exercise Tolerance: Good hypertension, Pt. on medications Normal cardiovascular exam Rhythm:regular Rate:Normal     Neuro/Psych negative neurological ROS  negative psych ROS   GI/Hepatic negative GI ROS, Neg liver ROS,   Endo/Other  diabetes, Well Controlled, Type 2, Oral Hypoglycemic Agents  Renal/GU negative Renal ROS  negative genitourinary   Musculoskeletal   Abdominal   Peds  Hematology negative hematology ROS (+)   Anesthesia Other Findings   Reproductive/Obstetrics negative OB ROS                             Anesthesia Physical Anesthesia Plan  ASA: III  Anesthesia Plan: MAC   Post-op Pain Management:    Induction:   Airway Management Planned:   Additional Equipment:   Intra-op Plan:   Post-operative Plan:   Informed Consent: I have reviewed the patients History and Physical, chart, labs and discussed the procedure including the risks, benefits and alternatives for the proposed anesthesia with the patient or authorized representative who has indicated his/her understanding and acceptance.   Dental Advisory Given  Plan Discussed with: CRNA  Anesthesia Plan Comments:         Anesthesia Quick Evaluation

## 2016-02-12 ENCOUNTER — Other Ambulatory Visit: Payer: Self-pay | Admitting: Gastroenterology

## 2016-02-15 ENCOUNTER — Encounter (HOSPITAL_COMMUNITY): Payer: Self-pay | Admitting: *Deleted

## 2016-02-19 ENCOUNTER — Encounter (HOSPITAL_COMMUNITY): Payer: Self-pay

## 2016-02-19 ENCOUNTER — Ambulatory Visit (HOSPITAL_COMMUNITY): Payer: 59 | Admitting: Certified Registered"

## 2016-02-19 ENCOUNTER — Ambulatory Visit (HOSPITAL_COMMUNITY)
Admission: RE | Admit: 2016-02-19 | Discharge: 2016-02-19 | Disposition: A | Discharge status: Home / Self Care | Payer: 59 | Source: Ambulatory Visit | Attending: Gastroenterology | Admitting: Gastroenterology

## 2016-02-19 ENCOUNTER — Encounter (HOSPITAL_COMMUNITY)
Admission: RE | Disposition: A | Discharge status: Home / Self Care | Payer: Self-pay | Source: Ambulatory Visit | Attending: Gastroenterology

## 2016-02-19 DIAGNOSIS — J309 Allergic rhinitis, unspecified: Secondary | ICD-10-CM | POA: Insufficient documentation

## 2016-02-19 DIAGNOSIS — K5730 Diverticulosis of large intestine without perforation or abscess without bleeding: Secondary | ICD-10-CM | POA: Insufficient documentation

## 2016-02-19 DIAGNOSIS — E7800 Pure hypercholesterolemia, unspecified: Secondary | ICD-10-CM | POA: Diagnosis not present

## 2016-02-19 DIAGNOSIS — Z7951 Long term (current) use of inhaled steroids: Secondary | ICD-10-CM | POA: Diagnosis not present

## 2016-02-19 DIAGNOSIS — F329 Major depressive disorder, single episode, unspecified: Secondary | ICD-10-CM | POA: Diagnosis not present

## 2016-02-19 DIAGNOSIS — D124 Benign neoplasm of descending colon: Secondary | ICD-10-CM | POA: Insufficient documentation

## 2016-02-19 DIAGNOSIS — E119 Type 2 diabetes mellitus without complications: Secondary | ICD-10-CM | POA: Insufficient documentation

## 2016-02-19 DIAGNOSIS — I10 Essential (primary) hypertension: Secondary | ICD-10-CM | POA: Insufficient documentation

## 2016-02-19 DIAGNOSIS — Z1211 Encounter for screening for malignant neoplasm of colon: Secondary | ICD-10-CM | POA: Diagnosis present

## 2016-02-19 DIAGNOSIS — Z87891 Personal history of nicotine dependence: Secondary | ICD-10-CM | POA: Insufficient documentation

## 2016-02-19 DIAGNOSIS — Z7984 Long term (current) use of oral hypoglycemic drugs: Secondary | ICD-10-CM | POA: Diagnosis not present

## 2016-02-19 DIAGNOSIS — Z79899 Other long term (current) drug therapy: Secondary | ICD-10-CM | POA: Diagnosis not present

## 2016-02-19 HISTORY — DX: Gastro-esophageal reflux disease without esophagitis: K21.9

## 2016-02-19 HISTORY — DX: Diverticulosis of intestine, part unspecified, without perforation or abscess without bleeding: K57.90

## 2016-02-19 HISTORY — PX: COLONOSCOPY WITH PROPOFOL: SHX5780

## 2016-02-19 LAB — GLUCOSE, CAPILLARY: Glucose-Capillary: 143 mg/dL — ABNORMAL HIGH (ref 65–99)

## 2016-02-19 SURGERY — COLONOSCOPY WITH PROPOFOL
Anesthesia: Monitor Anesthesia Care

## 2016-02-19 MED ORDER — PROPOFOL 10 MG/ML IV BOLUS
INTRAVENOUS | Status: AC
  Filled 2016-02-19: qty 20

## 2016-02-19 MED ORDER — PROPOFOL 10 MG/ML IV BOLUS
INTRAVENOUS | Status: DC | PRN
  Administered 2016-02-19 (×2): 50 mg via INTRAVENOUS
  Administered 2016-02-19: 100 mg via INTRAVENOUS

## 2016-02-19 MED ORDER — SODIUM CHLORIDE 0.9 % IV SOLN: INTRAVENOUS | Status: DC

## 2016-02-19 MED ORDER — LACTATED RINGERS IV SOLN
INTRAVENOUS | Status: DC
  Administered 2016-02-19: 13:00:00 via INTRAVENOUS

## 2016-02-19 SURGICAL SUPPLY — 21 items

## 2016-02-19 NOTE — Transfer of Care (Signed)
Immediate Anesthesia Transfer of Care Note  Patient: Shawn Yu  Procedure(s) Performed: Procedure(s): COLONOSCOPY WITH PROPOFOL (N/A)  Patient Location: PACU  Anesthesia Type:MAC  Level of Consciousness:  sedated, patient cooperative and responds to stimulation  Airway & Oxygen Therapy:Patient Spontanous Breathing   Post-op Assessment:  Report given to PACU RN and Post -op Vital signs reviewed and stable  Post vital signs:  Reviewed and stable  Last Vitals:  Vitals:   02/19/16 1232  BP: (!) 146/89  Pulse: 85  Resp: 12  Temp: 123456 C    Complications: No apparent anesthesia complications

## 2016-02-19 NOTE — H&P (Signed)
  Procedure: Screening colonoscopy. Normal screening colonoscopy was performed on 01/13/2006  History: The patient is a 60 year old male born 01/24/1956. He is scheduled to undergo a repeat screening colonoscopy today  Past medical history: Hypertension. Hypercholesterolemia. Depression. Type 2 diabetes mellitus. Allergic rhinitis.  Medication allergies: None  Exam: Patient is alert and lying comfortably on the endoscopy stretcher. Abdomen is soft and nontender to palpation. Lungs are clear to auscultation. Cardiac exam reveals a regular rhythm.  Plan: Proceed with screening colonoscopy

## 2016-02-19 NOTE — Discharge Instructions (Signed)

## 2016-02-19 NOTE — Anesthesia Postprocedure Evaluation (Signed)
Anesthesia Post Note  Patient: Shawn Yu  Procedure(s) Performed: Procedure(s) (LRB): COLONOSCOPY WITH PROPOFOL (N/A)  Patient location during evaluation: Endoscopy Anesthesia Type: MAC Level of consciousness: awake and alert Pain management: pain level controlled Vital Signs Assessment: post-procedure vital signs reviewed and stable Respiratory status: spontaneous breathing, nonlabored ventilation, respiratory function stable and patient connected to nasal cannula oxygen Cardiovascular status: stable and blood pressure returned to baseline Anesthetic complications: no    Last Vitals:  Vitals:   02/19/16 1345 02/19/16 1350  BP: 117/80 132/84  Pulse: 81 78  Resp: (!) 22 18  Temp:      Last Pain:  Vitals:   02/19/16 1232  TempSrc: Oral                 [name]

## 2016-02-19 NOTE — Op Note (Signed)
[facility] Patient Name: Shawn Yu Procedure Date: 02/19/2016 MRN: [id number] Attending MD: Garlan Fair , MD Date of Birth: 1955/11/25 CSN: [id number] Age: 60 Admit Type: Outpatient Procedure:                Colonoscopy Indications:              Screening for colorectal malignant neoplasm Providers:                Garlan Fair, MD, Carolynn Comment, RN, Cherylynn Ridges, Technician, Lajuana Carry, CRNA Referring MD:              Medicines:                Propofol per Anesthesia Complications:            No immediate complications. Estimated Blood Loss:     Estimated blood loss: none. Procedure:                Pre-Anesthesia Assessment:                           - Prior to the procedure, a History and Physical                            was performed, and patient medications and                            allergies were reviewed. The patient's tolerance of                            previous anesthesia was also reviewed. The risks                            and benefits of the procedure and the sedation                            options and risks were discussed with the patient.                            All questions were answered, and informed consent                            was obtained. Prior Anticoagulants: The patient has                            taken aspirin, last dose was 1 day prior to                            procedure. ASA Grade Assessment: II - A patient                            with mild systemic disease. After reviewing the  risks and benefits, the patient was deemed in                            satisfactory condition to undergo the procedure.                           After obtaining informed consent, the colonoscope                            was passed under direct vision. Throughout the                            procedure, the patient's blood pressure, pulse, and                             oxygen saturations were monitored continuously. The                            [id number]) scope was introduced through                            the anus and advanced to the the cecum, identified                            by appendiceal orifice and ileocecal valve. The                            colonoscopy was performed without difficulty. The                            patient tolerated the procedure well. The quality                            of the bowel preparation was good. The ileocecal                            valve, the appendiceal orifice and the rectum were                            photographed. Scope In: 1:13:04 PM Scope Out: 1:31:58 PM Scope Withdrawal Time: 0 hours 12 minutes 3 seconds  Total Procedure Duration: 0 hours 18 minutes 54 seconds  Findings:      The perianal and digital rectal examinations were normal.      A 6 mm polyp was found in the mid descending colon. The polyp was       sessile. The polyp was removed with a cold snare. Resection and       retrieval were complete.      Multiple small and large-mouthed diverticula were found in the sigmoid       colon.      The exam was otherwise without abnormality. Impression:               - One 6 mm polyp in the mid descending colon,  removed with a cold snare. Resected and retrieved.                           - Diverticulosis in the sigmoid colon.                           - The examination was otherwise normal. Moderate Sedation:      N/A- Per Anesthesia Care Recommendation:           - Patient has a contact number available for                            emergencies. The signs and symptoms of potential                            delayed complications were discussed with the                            patient. Return to normal activities tomorrow.                            Written discharge instructions were provided to the                             patient.                           - Repeat colonoscopy date to be determined after                            pending pathology results are reviewed for                            surveillance.                           - Resume previous diet.                           - Continue present medications. Procedure Code(s):        --- Professional ---                           [phone], Colonoscopy, flexible; with removal of                            tumor(s), polyp(s), or other lesion(s) by snare                            technique Diagnosis Code(s):        --- Professional ---                           Z12.11, Encounter for screening for malignant                            neoplasm of colon  D12.4, Benign neoplasm of descending colon                           K57.30, Diverticulosis of large intestine without                            perforation or abscess without bleeding CPT copyright 2016 [employer]. All rights reserved. The codes documented in this report are preliminary and upon coder review may  be revised to meet current compliance requirements. Earle Gell, MD Garlan Fair, MD 02/19/2016 1:39:53 PM This report has been signed electronically. Number of Addenda: 0

## 2016-02-19 NOTE — Anesthesia Preprocedure Evaluation (Signed)
Anesthesia Evaluation  Patient identified by MRN, date of birth, ID band Patient awake    Reviewed: Allergy & Precautions, NPO status , Patient's Chart, lab work & pertinent test results  Airway Mallampati: II  TM Distance: >3 FB Neck ROM: Full    Dental no notable dental hx.    Pulmonary neg pulmonary ROS, former smoker,    Pulmonary exam normal breath sounds clear to auscultation       Cardiovascular hypertension, Pt. on medications negative cardio ROS Normal cardiovascular exam Rhythm:Regular Rate:Normal     Neuro/Psych negative neurological ROS  negative psych ROS   GI/Hepatic negative GI ROS, Neg liver ROS,   Endo/Other  negative endocrine ROSdiabetes, Type 2, Oral Hypoglycemic Agents  Renal/GU negative Renal ROS  negative genitourinary   Musculoskeletal negative musculoskeletal ROS (+)   Abdominal   Peds negative pediatric ROS (+)  Hematology negative hematology ROS (+)   Anesthesia Other Findings   Reproductive/Obstetrics negative OB ROS                             Anesthesia Physical Anesthesia Plan  ASA: II  Anesthesia Plan: MAC   Post-op Pain Management:    Induction:   Airway Management Planned:   Additional Equipment:   Intra-op Plan:   Post-operative Plan:   Informed Consent: I have reviewed the patients History and Physical, chart, labs and discussed the procedure including the risks, benefits and alternatives for the proposed anesthesia with the patient or authorized representative who has indicated his/her understanding and acceptance.   Dental advisory given  Plan Discussed with: CRNA  Anesthesia Plan Comments:         Anesthesia Quick Evaluation

## 2016-02-20 ENCOUNTER — Encounter (HOSPITAL_COMMUNITY): Payer: Self-pay | Admitting: Gastroenterology

## 2016-03-25 ENCOUNTER — Emergency Department (HOSPITAL_COMMUNITY): Payer: 59

## 2016-03-25 ENCOUNTER — Encounter (HOSPITAL_COMMUNITY): Payer: Self-pay

## 2016-03-25 ENCOUNTER — Emergency Department (HOSPITAL_COMMUNITY)
Admission: EM | Admit: 2016-03-25 | Discharge: 2016-03-25 | Disposition: A | Discharge status: Home / Self Care | Payer: 59 | Attending: Emergency Medicine | Admitting: Emergency Medicine

## 2016-03-25 DIAGNOSIS — Y92410 Unspecified street and highway as the place of occurrence of the external cause: Secondary | ICD-10-CM | POA: Insufficient documentation

## 2016-03-25 DIAGNOSIS — S2241XA Multiple fractures of ribs, right side, initial encounter for closed fracture: Secondary | ICD-10-CM | POA: Diagnosis not present

## 2016-03-25 DIAGNOSIS — Z79899 Other long term (current) drug therapy: Secondary | ICD-10-CM | POA: Diagnosis not present

## 2016-03-25 DIAGNOSIS — Z23 Encounter for immunization: Secondary | ICD-10-CM | POA: Diagnosis not present

## 2016-03-25 DIAGNOSIS — S42301D Unspecified fracture of shaft of humerus, right arm, subsequent encounter for fracture with routine healing: Secondary | ICD-10-CM

## 2016-03-25 DIAGNOSIS — S27329A Contusion of lung, unspecified, initial encounter: Secondary | ICD-10-CM

## 2016-03-25 DIAGNOSIS — Z7984 Long term (current) use of oral hypoglycemic drugs: Secondary | ICD-10-CM | POA: Diagnosis not present

## 2016-03-25 DIAGNOSIS — E119 Type 2 diabetes mellitus without complications: Secondary | ICD-10-CM | POA: Insufficient documentation

## 2016-03-25 DIAGNOSIS — Z87891 Personal history of nicotine dependence: Secondary | ICD-10-CM | POA: Insufficient documentation

## 2016-03-25 DIAGNOSIS — R52 Pain, unspecified: Secondary | ICD-10-CM

## 2016-03-25 DIAGNOSIS — Y939 Activity, unspecified: Secondary | ICD-10-CM | POA: Insufficient documentation

## 2016-03-25 DIAGNOSIS — I10 Essential (primary) hypertension: Secondary | ICD-10-CM | POA: Insufficient documentation

## 2016-03-25 DIAGNOSIS — S2241XD Multiple fractures of ribs, right side, subsequent encounter for fracture with routine healing: Secondary | ICD-10-CM

## 2016-03-25 DIAGNOSIS — Y999 Unspecified external cause status: Secondary | ICD-10-CM | POA: Diagnosis not present

## 2016-03-25 DIAGNOSIS — S299XXA Unspecified injury of thorax, initial encounter: Secondary | ICD-10-CM | POA: Diagnosis present

## 2016-03-25 MED ORDER — MORPHINE SULFATE (PF) 4 MG/ML IV SOLN
4.0000 mg | Freq: Once | INTRAVENOUS | Status: AC
  Administered 2016-03-25: 4 mg via INTRAVENOUS
  Filled 2016-03-25: qty 1

## 2016-03-25 MED ORDER — OXYCODONE-ACETAMINOPHEN 5-325 MG PO TABS: 1.0000 | ORAL_TABLET | Freq: Four times a day (QID) | ORAL | 0 refills | Status: DC | PRN

## 2016-03-25 MED ORDER — OXYCODONE-ACETAMINOPHEN 5-325 MG PO TABS
1.0000 | ORAL_TABLET | Freq: Once | ORAL | Status: AC
  Administered 2016-03-25: 1 via ORAL
  Filled 2016-03-25: qty 1

## 2016-03-25 MED ORDER — ONDANSETRON HCL 4 MG/2ML IJ SOLN
4.0000 mg | Freq: Once | INTRAMUSCULAR | Status: AC
Start: 2016-03-25 — End: 2016-03-25
  Administered 2016-03-25: 4 mg via INTRAVENOUS
  Filled 2016-03-25: qty 2

## 2016-03-25 MED ORDER — TETANUS-DIPHTH-ACELL PERTUSSIS 5-2.5-18.5 LF-MCG/0.5 IM SUSP
0.5000 mL | Freq: Once | INTRAMUSCULAR | Status: AC
  Administered 2016-03-25: 0.5 mL via INTRAMUSCULAR
  Filled 2016-03-25: qty 0.5
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
## 2016-03-25 NOTE — ED Notes (Signed)
Pt back from CT

## 2016-03-25 NOTE — ED Notes (Signed)
Patient transported to CT 
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
## 2016-03-25 NOTE — Discharge Instructions (Signed)
Take percocet for breakthrough pain, do not drink alcohol, drive, care for children or do other critical tasks while taking percocet.  ° °It is very important that you take deep breaths to prevent lung collapse and infection. ° °Either use your incentive spirometer or take 10 deep breaths every hour to prevent lung collapse. ° °If you develop cough, fever or shortness of breath return immediately to the emergency room.  ° ° °Please follow with your primary care doctor in the next 2 days for a check-up. They must obtain records for further management.  ° °Do not hesitate to return to the Emergency Department for any new, worsening or concerning symptoms.  ° °

## 2016-03-25 NOTE — ED Notes (Signed)
RN attempted IV 2x. Will have another RN attempt.

## 2016-04-28 ENCOUNTER — Other Ambulatory Visit: Payer: Self-pay | Admitting: Nurse Practitioner

## 2016-04-28 ENCOUNTER — Ambulatory Visit
Admission: RE | Admit: 2016-04-28 | Discharge: 2016-04-28 | Disposition: A | Discharge status: Home / Self Care | Payer: 59 | Source: Ambulatory Visit | Attending: Nurse Practitioner | Admitting: Nurse Practitioner

## 2016-04-28 DIAGNOSIS — R071 Chest pain on breathing: Secondary | ICD-10-CM

## 2017-01-13 DIAGNOSIS — G4700 Insomnia, unspecified: Secondary | ICD-10-CM | POA: Diagnosis not present

## 2017-01-13 DIAGNOSIS — I10 Essential (primary) hypertension: Secondary | ICD-10-CM | POA: Diagnosis not present

## 2017-01-13 DIAGNOSIS — E1165 Type 2 diabetes mellitus with hyperglycemia: Secondary | ICD-10-CM | POA: Diagnosis not present

## 2017-07-08 DIAGNOSIS — I10 Essential (primary) hypertension: Secondary | ICD-10-CM | POA: Diagnosis not present

## 2017-07-08 DIAGNOSIS — Z23 Encounter for immunization: Secondary | ICD-10-CM | POA: Diagnosis not present

## 2017-07-08 DIAGNOSIS — Z0000 Encounter for general adult medical examination without abnormal findings: Secondary | ICD-10-CM | POA: Diagnosis not present

## 2017-07-08 DIAGNOSIS — G4700 Insomnia, unspecified: Secondary | ICD-10-CM | POA: Diagnosis not present

## 2017-07-08 DIAGNOSIS — E1165 Type 2 diabetes mellitus with hyperglycemia: Secondary | ICD-10-CM | POA: Diagnosis not present
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
## 2017-11-16 DIAGNOSIS — E119 Type 2 diabetes mellitus without complications: Secondary | ICD-10-CM | POA: Diagnosis not present

## 2017-11-17 DIAGNOSIS — E1169 Type 2 diabetes mellitus with other specified complication: Secondary | ICD-10-CM | POA: Diagnosis not present

## 2017-11-17 DIAGNOSIS — G4700 Insomnia, unspecified: Secondary | ICD-10-CM | POA: Diagnosis not present

## 2017-11-17 DIAGNOSIS — I10 Essential (primary) hypertension: Secondary | ICD-10-CM | POA: Diagnosis not present

## 2017-11-25 DIAGNOSIS — H1132 Conjunctival hemorrhage, left eye: Secondary | ICD-10-CM | POA: Diagnosis not present

## 2017-12-25 DIAGNOSIS — J209 Acute bronchitis, unspecified: Secondary | ICD-10-CM | POA: Diagnosis not present

## 2018-03-11 DIAGNOSIS — I10 Essential (primary) hypertension: Secondary | ICD-10-CM | POA: Diagnosis not present

## 2018-03-11 DIAGNOSIS — E1169 Type 2 diabetes mellitus with other specified complication: Secondary | ICD-10-CM | POA: Diagnosis not present

## 2018-03-11 DIAGNOSIS — N400 Enlarged prostate without lower urinary tract symptoms: Secondary | ICD-10-CM | POA: Diagnosis not present

## 2018-03-11 DIAGNOSIS — R3911 Hesitancy of micturition: Secondary | ICD-10-CM | POA: Diagnosis not present

## 2018-03-17 DIAGNOSIS — Z79891 Long term (current) use of opiate analgesic: Secondary | ICD-10-CM | POA: Diagnosis not present

## 2018-07-12 DIAGNOSIS — G4700 Insomnia, unspecified: Secondary | ICD-10-CM | POA: Diagnosis not present

## 2018-07-12 DIAGNOSIS — E1169 Type 2 diabetes mellitus with other specified complication: Secondary | ICD-10-CM | POA: Diagnosis not present

## 2018-07-12 DIAGNOSIS — E1165 Type 2 diabetes mellitus with hyperglycemia: Secondary | ICD-10-CM | POA: Diagnosis not present

## 2018-07-12 DIAGNOSIS — I10 Essential (primary) hypertension: Secondary | ICD-10-CM | POA: Diagnosis not present

## 2018-07-12 DIAGNOSIS — Z0000 Encounter for general adult medical examination without abnormal findings: Secondary | ICD-10-CM | POA: Diagnosis not present

## 2018-08-04 ENCOUNTER — Other Ambulatory Visit: Payer: Self-pay | Admitting: Internal Medicine

## 2018-08-04 ENCOUNTER — Ambulatory Visit
Admission: RE | Admit: 2018-08-04 | Discharge: 2018-08-04 | Disposition: A | Discharge status: Home / Self Care | Payer: 59 | Source: Ambulatory Visit | Attending: Internal Medicine | Admitting: Internal Medicine

## 2018-08-04 DIAGNOSIS — M545 Low back pain, unspecified: Secondary | ICD-10-CM

## 2018-08-04 DIAGNOSIS — M47816 Spondylosis without myelopathy or radiculopathy, lumbar region: Secondary | ICD-10-CM | POA: Diagnosis not present

## 2018-09-28 ENCOUNTER — Inpatient Hospital Stay (HOSPITAL_COMMUNITY)
Admission: EM | Admit: 2018-09-28 | Discharge: 2018-10-07 | DRG: 683 | Disposition: A | Discharge status: Home / Self Care | Payer: 59 | Attending: Family Medicine | Admitting: Family Medicine

## 2018-09-28 ENCOUNTER — Encounter (HOSPITAL_COMMUNITY): Payer: Self-pay | Admitting: Emergency Medicine

## 2018-09-28 ENCOUNTER — Inpatient Hospital Stay (HOSPITAL_COMMUNITY): Payer: 59

## 2018-09-28 ENCOUNTER — Emergency Department (HOSPITAL_COMMUNITY): Payer: 59

## 2018-09-28 ENCOUNTER — Other Ambulatory Visit: Payer: Self-pay

## 2018-09-28 DIAGNOSIS — Z7984 Long term (current) use of oral hypoglycemic drugs: Secondary | ICD-10-CM

## 2018-09-28 DIAGNOSIS — G4700 Insomnia, unspecified: Secondary | ICD-10-CM | POA: Diagnosis present

## 2018-09-28 DIAGNOSIS — I10 Essential (primary) hypertension: Secondary | ICD-10-CM | POA: Diagnosis present

## 2018-09-28 DIAGNOSIS — R944 Abnormal results of kidney function studies: Secondary | ICD-10-CM | POA: Diagnosis not present

## 2018-09-28 DIAGNOSIS — R112 Nausea with vomiting, unspecified: Secondary | ICD-10-CM

## 2018-09-28 DIAGNOSIS — J90 Pleural effusion, not elsewhere classified: Secondary | ICD-10-CM | POA: Diagnosis present

## 2018-09-28 DIAGNOSIS — Z833 Family history of diabetes mellitus: Secondary | ICD-10-CM

## 2018-09-28 DIAGNOSIS — E875 Hyperkalemia: Secondary | ICD-10-CM | POA: Diagnosis not present

## 2018-09-28 DIAGNOSIS — E785 Hyperlipidemia, unspecified: Secondary | ICD-10-CM | POA: Diagnosis present

## 2018-09-28 DIAGNOSIS — N289 Disorder of kidney and ureter, unspecified: Secondary | ICD-10-CM | POA: Diagnosis not present

## 2018-09-28 DIAGNOSIS — E876 Hypokalemia: Secondary | ICD-10-CM | POA: Diagnosis not present

## 2018-09-28 DIAGNOSIS — Z8249 Family history of ischemic heart disease and other diseases of the circulatory system: Secondary | ICD-10-CM

## 2018-09-28 DIAGNOSIS — R34 Anuria and oliguria: Secondary | ICD-10-CM | POA: Diagnosis not present

## 2018-09-28 DIAGNOSIS — Z87891 Personal history of nicotine dependence: Secondary | ICD-10-CM

## 2018-09-28 DIAGNOSIS — F329 Major depressive disorder, single episode, unspecified: Secondary | ICD-10-CM | POA: Diagnosis present

## 2018-09-28 DIAGNOSIS — E1129 Type 2 diabetes mellitus with other diabetic kidney complication: Secondary | ICD-10-CM

## 2018-09-28 DIAGNOSIS — R079 Chest pain, unspecified: Secondary | ICD-10-CM | POA: Diagnosis not present

## 2018-09-28 DIAGNOSIS — N179 Acute kidney failure, unspecified: Principal | ICD-10-CM

## 2018-09-28 DIAGNOSIS — F419 Anxiety disorder, unspecified: Secondary | ICD-10-CM | POA: Diagnosis present

## 2018-09-28 DIAGNOSIS — R197 Diarrhea, unspecified: Secondary | ICD-10-CM | POA: Diagnosis not present

## 2018-09-28 DIAGNOSIS — Z79899 Other long term (current) drug therapy: Secondary | ICD-10-CM

## 2018-09-28 DIAGNOSIS — T471X5A Adverse effect of other antacids and anti-gastric-secretion drugs, initial encounter: Secondary | ICD-10-CM | POA: Diagnosis present

## 2018-09-28 DIAGNOSIS — R509 Fever, unspecified: Secondary | ICD-10-CM | POA: Diagnosis not present

## 2018-09-28 DIAGNOSIS — E119 Type 2 diabetes mellitus without complications: Secondary | ICD-10-CM

## 2018-09-28 DIAGNOSIS — N10 Acute tubulo-interstitial nephritis: Secondary | ICD-10-CM | POA: Diagnosis present

## 2018-09-28 DIAGNOSIS — K529 Noninfective gastroenteritis and colitis, unspecified: Secondary | ICD-10-CM | POA: Diagnosis not present

## 2018-09-28 DIAGNOSIS — E872 Acidosis: Secondary | ICD-10-CM | POA: Diagnosis not present

## 2018-09-28 DIAGNOSIS — K219 Gastro-esophageal reflux disease without esophagitis: Secondary | ICD-10-CM | POA: Diagnosis present

## 2018-09-28 LAB — URINALYSIS, ROUTINE W REFLEX MICROSCOPIC
Bilirubin Urine: NEGATIVE
Glucose, UA: >=500 mg/dL — AB
Ketones, ur: NEGATIVE mg/dL
Leukocytes,Ua: NEGATIVE
NITRITE: NEGATIVE
PH: 5 (ref 5.0–8.0)
Protein, ur: 100 mg/dL — AB
Specific Gravity, Urine: 1.016 (ref 1.005–1.030)

## 2018-09-28 LAB — BRAIN NATRIURETIC PEPTIDE: B Natriuretic Peptide: 96.8 pg/mL (ref 0.0–100.0)

## 2018-09-28 LAB — COMPREHENSIVE METABOLIC PANEL
ALT: 25 U/L (ref 0–44)
AST: 29 U/L (ref 15–41)
Albumin: 3.9 g/dL (ref 3.5–5.0)
Alkaline Phosphatase: 55 U/L (ref 38–126)
Anion gap: 22 — ABNORMAL HIGH (ref 5–15)
BILIRUBIN TOTAL: 1 mg/dL (ref 0.3–1.2)
BUN: 73 mg/dL — ABNORMAL HIGH (ref 8–23)
CO2: 18 mmol/L — ABNORMAL LOW (ref 22–32)
Calcium: 9.2 mg/dL (ref 8.9–10.3)
Chloride: 95 mmol/L — ABNORMAL LOW (ref 98–111)
Creatinine, Ser: 6.8 mg/dL — ABNORMAL HIGH (ref 0.61–1.24)
GFR calc Af Amer: 9 mL/min — ABNORMAL LOW (ref >60)
GFR calc non Af Amer: 8 mL/min — ABNORMAL LOW (ref >60)
Glucose, Bld: 145 mg/dL — ABNORMAL HIGH (ref 70–99)
Potassium: 5.7 mmol/L — ABNORMAL HIGH (ref 3.5–5.1)
Sodium: 135 mmol/L (ref 135–145)
TOTAL PROTEIN: 6.6 g/dL (ref 6.5–8.1)

## 2018-09-28 LAB — CBC
HEMATOCRIT: 45.5 % (ref 39.0–52.0)
Hemoglobin: 14.5 g/dL (ref 13.0–17.0)
MCH: 29.4 pg (ref 26.0–34.0)
MCHC: 31.9 g/dL (ref 30.0–36.0)
MCV: 92.3 fL (ref 80.0–100.0)
Platelets: 307 10*3/uL (ref 150–400)
RBC: 4.93 MIL/uL (ref 4.22–5.81)
RDW: 13.6 % (ref 11.5–15.5)
WBC: 14.7 10*3/uL — AB (ref 4.0–10.5)
nRBC: 0 % (ref 0.0–0.2)

## 2018-09-28 LAB — HEMOGLOBIN A1C
HEMOGLOBIN A1C: 8.3 % — AB (ref 4.8–5.6)
Mean Plasma Glucose: 191.51 mg/dL

## 2018-09-28 LAB — MAGNESIUM: Magnesium: 2.5 mg/dL — ABNORMAL HIGH (ref 1.7–2.4)

## 2018-09-28 LAB — CBG MONITORING, ED: GLUCOSE-CAPILLARY: 85 mg/dL (ref 70–99)

## 2018-09-28 LAB — LIPASE, BLOOD: Lipase: 41 U/L (ref 11–51)

## 2018-09-28 MED ORDER — ACETAMINOPHEN 650 MG RE SUPP: 650.0000 mg | Freq: Four times a day (QID) | RECTAL | Status: DC | PRN

## 2018-09-28 MED ORDER — BUSPIRONE HCL 10 MG PO TABS
15.0000 mg | ORAL_TABLET | Freq: Three times a day (TID) | ORAL | Status: DC
  Administered 2018-09-28: 15 mg via ORAL
  Filled 2018-09-28: qty 2

## 2018-09-28 MED ORDER — SODIUM BICARBONATE 650 MG PO TABS
1300.0000 mg | ORAL_TABLET | Freq: Two times a day (BID) | ORAL | Status: DC
  Administered 2018-09-28 – 2018-10-02 (×8): 1300 mg via ORAL
  Filled 2018-09-28 (×8): qty 2

## 2018-09-28 MED ORDER — BUPROPION HCL ER (XL) 300 MG PO TB24
300.0000 mg | ORAL_TABLET | Freq: Every day | ORAL | Status: DC
  Administered 2018-09-29 – 2018-10-07 (×9): 300 mg via ORAL
  Filled 2018-09-28: qty 2
  Filled 2018-09-28 (×9): qty 1

## 2018-09-28 MED ORDER — INSULIN ASPART 100 UNIT/ML ~~LOC~~ SOLN
0.0000 [IU] | Freq: Three times a day (TID) | SUBCUTANEOUS | Status: DC
  Administered 2018-09-29: 2 [IU] via SUBCUTANEOUS
  Administered 2018-09-29: 3 [IU] via SUBCUTANEOUS
  Administered 2018-09-29: 2 [IU] via SUBCUTANEOUS

## 2018-09-28 MED ORDER — ACETAMINOPHEN 325 MG PO TABS
650.0000 mg | ORAL_TABLET | Freq: Four times a day (QID) | ORAL | Status: DC | PRN
  Administered 2018-09-28 – 2018-10-04 (×4): 650 mg via ORAL
  Filled 2018-09-28 (×5): qty 2

## 2018-09-28 MED ORDER — AMLODIPINE BESYLATE 10 MG PO TABS
10.0000 mg | ORAL_TABLET | Freq: Every evening | ORAL | Status: DC
  Administered 2018-09-28 – 2018-10-06 (×9): 10 mg via ORAL
  Filled 2018-09-28 (×6): qty 1
  Filled 2018-09-28: qty 2
  Filled 2018-09-28 (×2): qty 1

## 2018-09-28 MED ORDER — ZOLPIDEM TARTRATE 5 MG PO TABS
5.0000 mg | ORAL_TABLET | Freq: Once | ORAL | Status: AC
  Administered 2018-09-29: 5 mg via ORAL
  Filled 2018-09-28: qty 1

## 2018-09-28 MED ORDER — SODIUM CHLORIDE 0.9 % IV BOLUS
500.0000 mL | Freq: Once | INTRAVENOUS | Status: AC
  Administered 2018-09-28: 500 mL via INTRAVENOUS

## 2018-09-28 MED ORDER — HYDRALAZINE HCL 20 MG/ML IJ SOLN: 5.0000 mg | INTRAMUSCULAR | Status: DC | PRN

## 2018-09-28 MED ORDER — MIRTAZAPINE 15 MG PO TABS
15.0000 mg | ORAL_TABLET | Freq: Every day | ORAL | Status: DC
  Filled 2018-09-28: qty 1

## 2018-09-28 MED ORDER — QUETIAPINE FUMARATE 100 MG PO TABS
100.0000 mg | ORAL_TABLET | Freq: Every day | ORAL | Status: DC
  Filled 2018-09-28: qty 1

## 2018-09-28 NOTE — ED Triage Notes (Signed)
Pt reports he has been nauseated for the past 2 days. Pt reports going to his PCP today and being told he had kidney failure and needed to come to the hospital.

## 2018-09-28 NOTE — Consult Note (Signed)
Reason for Consult:AKI Referring Physician: ED MD  Shawn Yu is an 63 y.o. male.  HPI: 63 yr male with >10 yr DM, > 87yr HTN, presents now when told by primary MD that kidneys not working, come to ED.  Cr of 6.8.  Last Cr here 1/17 of 1.0, but has labs at primary MD.  Has not felt well for about 2 d, but no N, V, D or poor intake. No fevers, chills, hemotysis.  Has had a cold for about a month with cough, started as significant malaise, fevers.  He has no hx stones , UTIs, or FH of renal dz.  No NSAIDS. No FH of inherited eye, ms skel, hearing problems. No skin rash but occ aches and pains.  Has had back pain for over a month and seen primary for that , told arthriis.  Has Nocturia q 1 h at night, and freq during the day. BS has been in 100s.  No HA, visual, change, neuro sx. No ankle edema.  Is on omeprazole, Losartan, metformen. Constitutional: as above Eyes: negative Ears, nose, mouth, throat, and face: negative Respiratory: cough Cardiovascular: negative Gastrointestinal: negative Genitourinary:nocturia Integument/breast: negative Musculoskeletal:back Neurological: negative Endocrine: DM Allergic/Immunologic: negative   Past Medical History:  Diagnosis Date  . Abnormal EKG 3-4 yrs ago  . Anxiety   . Chest pain, atypical 3 4 yrs ago   stres test normal  . Depression   . Diverticulosis   . DM (diabetes mellitus) ([place])   . Erectile dysfunction    4 weeks ago  . GERD (gastroesophageal reflux disease)   . Hyperlipidemia   . Hypertension   . Obesity     Past Surgical History:  Procedure Laterality Date  . COLONOSCOPY WITH PROPOFOL N/A 02/19/2016   Procedure: COLONOSCOPY WITH PROPOFOL;  Surgeon: Garlan Fair, MD;  Location: [facility];  Service: Endoscopy;  Laterality: N/A;  . colonscopy  2007    Family History  Problem Relation Age of Onset  . Hypertension Mother   . Diabetes Father   . Heart failure Father   . Hypertension Brother   . Hyperlipidemia Brother    . Cancer Brother   . Hyperlipidemia Brother   . HIV Brother     Social History:  reports that he quit smoking about 12 years ago. His smoking use included cigarettes. He has never used smokeless tobacco. He reports current alcohol use. He reports that he does not use drugs.  Allergies: No Known Allergies  Medications: I have reviewed the patient's current medications. Prior to Admission: (Not in a hospital admission)   Results for orders placed or performed during the hospital encounter of 09/28/18 (from the past 48 hour(s))  Lipase, blood     Status: None   Collection Time: 09/28/18  1:43 PM  Result Value Ref Range   Lipase 41 11 - 51 U/L    Comment: Performed at [facility], [place] [address] [place], [place] [address]  Comprehensive metabolic panel     Status: Abnormal   Collection Time: 09/28/18  1:43 PM  Result Value Ref Range   Sodium 135 135 - 145 mmol/L   Potassium 5.7 (H) 3.5 - 5.1 mmol/L   Chloride 95 (L) 98 - 111 mmol/L   CO2 18 (L) 22 - 32 mmol/L   Glucose, Bld 145 (H) 70 - 99 mg/dL   BUN 73 (H) 8 - 23 mg/dL   Creatinine, Ser 6.80 (H) 0.61 - 1.24 mg/dL   Calcium 9.2  8.9 - 10.3 mg/dL   Total Protein 6.6 6.5 - 8.1 g/dL   Albumin 3.9 3.5 - 5.0 g/dL   AST 29 15 - 41 U/L   ALT 25 0 - 44 U/L   Alkaline Phosphatase 55 38 - 126 U/L   Total Bilirubin 1.0 0.3 - 1.2 mg/dL   GFR calc non Af Amer 8 (L) >60 mL/min   GFR calc Af Amer 9 (L) >60 mL/min   Anion gap 22 (H) 5 - 15    Comment: Performed at [facility] [address], [place], [place] [address]  CBC     Status: Abnormal   Collection Time: 09/28/18  1:43 PM  Result Value Ref Range   WBC 14.7 (H) 4.0 - 10.5 K/uL   RBC 4.93 4.22 - 5.81 MIL/uL   Hemoglobin 14.5 13.0 - 17.0 g/dL   HCT 45.5 39.0 - 52.0 %   MCV 92.3 80.0 - 100.0 fL   MCH 29.4 26.0 - 34.0 pg   MCHC 31.9 30.0 - 36.0 g/dL   RDW 13.6 11.5 - 15.5 %   Platelets 307 150 - 400 K/uL   nRBC 0.0 0.0 - 0.2 %    Comment: Performed at [facility], [place] [address] [place], [place] [address]  Magnesium     Status: Abnormal   Collection Time: 09/28/18  5:32 PM  Result Value Ref Range   Magnesium 2.5 (H) 1.7 - 2.4 mg/dL    Comment: Performed at [facility] [address] [place], [place] [address]  Urinalysis, Routine w reflex microscopic     Status: Abnormal   Collection Time: 09/28/18  5:42 PM  Result Value Ref Range   Color, Urine YELLOW YELLOW   APPearance HAZY (A) CLEAR   Specific Gravity, Urine 1.016 1.005 - 1.030   pH 5.0 5.0 - 8.0   Glucose, UA >=500 (A) NEGATIVE mg/dL   Hgb urine dipstick SMALL (A) NEGATIVE   Bilirubin Urine NEGATIVE NEGATIVE   Ketones, ur NEGATIVE NEGATIVE mg/dL   Protein, ur 100 (A) NEGATIVE mg/dL   Nitrite NEGATIVE NEGATIVE   Leukocytes,Ua NEGATIVE NEGATIVE   RBC / HPF 0-5 0 - 5 RBC/hpf   WBC, UA 6-10 0 - 5 WBC/hpf   Bacteria, UA RARE (A) NONE SEEN   Squamous Epithelial / LPF 0-5 0 - 5    Comment: Performed at [facility], [address] [place], [place] [address]  Brain natriuretic peptide     Status: None   Collection Time: 09/28/18  5:42 PM  Result Value Ref Range   B Natriuretic Peptide 96.8 0.0 - 100.0 pg/mL    Comment: Performed at [facility] [address] [place],  [address]  Hemoglobin A1c     Status: Abnormal   Collection Time: 09/28/18  5:42 PM  Result Value Ref Range   Hgb A1c MFr Bld 8.3 (H) 4.8 - 5.6 %    Comment: (NOTE) Pre diabetes:          5.7%-6.4% Diabetes:              >6.4% Glycemic control for   <7.0% adults with diabetes    Mean Plasma Glucose 191.51 mg/dL    Comment: Performed at [facility] [address] [place],  [address]  CBG monitoring, ED     Status: None   Collection Time: 09/28/18  6:44 PM  Result Value Ref Range   Glucose-Capillary 85  70 - 99 mg/dL    Dg Chest 2 View  Result Date: 09/28/2018 CLINICAL DATA:  Chest pain EXAM: CHEST - 2 VIEW COMPARISON:  04/28/2016 FINDINGS: Small right pleural  effusion with basilar atelectasis. Lungs are otherwise clear. Normal cardiomediastinal contours. IMPRESSION: Small right pleural effusion with associated atelectasis. Electronically Signed   By: Ulyses Jarred M.D.   On: 09/28/2018 18:54    ROS Blood pressure (!) 156/80, pulse 76, temperature 99 F (37.2 C), temperature source Oral, resp. rate (!) 22, height 5\' 10"  (1.778 m), weight 90.7 kg, SpO2 92 %. Physical Exam Physical Examination: General appearance - alert, well appearing, and in no distress and slightly pale Mental status - alert, oriented to person, place, and time Eyes - pupils equal and reactive, extraocular eye movements intact, funduscopic exam normal, discs flat and sharp Mouth - mucous membranes moist, pharynx normal without lesions Neck - adenopathy noted PCL Lymphatics - posterior cervical nodes Chest - rales noted R base Heart - S1 and S2 normal, systolic murmur QQ2/2 at apex Abdomen - soft, pos bs, liver down 4 cm Extremities - peripheral pulses normal, no pedal edema, no clubbing or cyanosis Skin - normal coloration and turgor, no rashes, no suspicious skin lesions noted  Assessment/Plan: 1 AKI do not feel this is chronic.  Mild acidemia, ^K, mild uremia but not a chronicsx and no severe predisposing illness.  On meds that could play role ie Losartan, metformen, and omeprazole.  No dehydrating illness. Urine with glucosuria?? And ^ WBC with mild protein, ?? AIN.  Obstruction also possible with his sx/ 2 HTN not bad and not low 3 DM fair control by hx, ??role of Glycosuria 4. Anemia not present 5. Metabolic Bone Disease:check 6 LBP ? Reflective of obstruction 7 Pleural effusion and not resolving cough ?? Post infectious. P Foley, hold home med, check urine chem , eos , U/S, serologies  Mauricia Area 09/28/2018, 7:25 PM

## 2018-09-28 NOTE — ED Provider Notes (Signed)
[facility] EMERGENCY DEPARTMENT Provider Note   CSN: [id number] Arrival date & time: 09/28/18  1314    History   Chief Complaint Chief Complaint  Patient presents with  . [place] Problems  . Nausea  . Emesis    HPI Shawn Yu is a 63 y.o. male.     63 y/o male with a PMH of GERD, HTN, DM2 presents to the ED sent in by PCP due to elevated creatine. Patient reports he was told by his PCP that he had changes in his creatinine level from his last lab work.  Patient reports he has been feeling more nauseated, has had some episodes of vomiting along with abdominal pain these past couple weeks.  He reports lower intake and food but reports gaining weight unintentionally.  He also describes some lower abdominal aches originating from his back towards the front.  He also endorses some hiccups he has had for the past few days.  Reports a subjective fever, has not tried any medical therapy for relieving symptoms.  He also endorses some diarrhea with dark stools, unsure whether they were bloody in nature.  Patient does have a previous history of smoking but reports quit few years ago.  He denies  Any chest pain, shortness of breath or other complaints. He denies any cardiac history but reports both of his parents died from complications with HF.      Past Medical History:  Diagnosis Date  . Abnormal EKG 3-4 yrs ago  . Anxiety   . Chest pain, atypical 3 4 yrs ago   stres test normal  . Depression   . Diverticulosis   . DM (diabetes mellitus) ([place])   . Erectile dysfunction    4 weeks ago  . GERD (gastroesophageal reflux disease)   . Hyperlipidemia   . Hypertension   . Obesity     Patient Active Problem List   Diagnosis Date Noted  . Leukocytosis 07/31/2015  . Essential hypertension 12/26/2012  . Hyperlipidemia 12/26/2012  . DM2 (diabetes mellitus, type 2) ([place]) 12/26/2012  . Erectile dysfunction 12/26/2012  . Abnormal electrocardiogram 12/26/2012  .  Atypical chest pain - normal nuclear stress test May 2014 12/26/2012    Past Surgical History:  Procedure Laterality Date  . COLONOSCOPY WITH PROPOFOL N/A 02/19/2016   Procedure: COLONOSCOPY WITH PROPOFOL;  Surgeon: Garlan Fair, MD;  Location: [facility];  Service: Endoscopy;  Laterality: N/A;  . colonscopy  2007        Home Medications    Prior to Admission medications   Medication Sig Start Date End Date Taking? Authorizing Provider  acetaminophen (TYLENOL) 325 MG tablet Take 650 mg by mouth every 6 (six) hours as needed for mild pain.    [provider]  amLODipine (NORVASC) 10 MG tablet Take 1 tablet by mouth every evening.  12/21/15   [provider]  atorvastatin (LIPITOR) 20 MG tablet Take 20 mg by mouth every evening.     [provider]  busPIRone (BUSPAR) 15 MG tablet Take 1 tablet by mouth 3 (three) times daily.  12/21/15   [provider]  estazolam (PROSOM) 2 MG tablet TK 1 T PO HS PRN 07/26/15   [provider]  FLUoxetine (PROZAC) 40 MG capsule Take 2 capsules by mouth daily. 01/06/16   [provider]  losartan (COZAAR) 50 MG tablet Take 50 mg by mouth every evening.     [provider]  metFORMIN (GLUCOPHAGE) 1000 MG tablet Take  1 tablet by mouth 2 (two) times daily. 12/28/15   [provider]  omeprazole (PRILOSEC) 20 MG capsule Take 20 mg by mouth daily as needed (heartburn).     [provider]  ONE TOUCH ULTRA TEST test strip CHECK BLOOD SUGAR 3 TIMES A DAY. 06/19/15   [provider]  ONGLYZA 5 MG TABS tablet TK 1 T PO QD 07/18/15   [provider]  oxyCODONE-acetaminophen (PERCOCET/ROXICET) 5-325 MG tablet Take 1-2 tablets by mouth every 6 (six) hours as needed for severe pain. May take 2 tablets PO q 6 hours for severe pain - Do not take with Tylenol as this tablet already contains tylenol 03/25/16   Pisciotta, Elmyra Ricks, PA-C    Family History Family History  Problem  Relation Age of Onset  . Hypertension Mother   . Diabetes Father   . Heart failure Father   . Hypertension Brother   . Hyperlipidemia Brother   . Cancer Brother   . Hyperlipidemia Brother   . HIV Brother     Social History Social History   Tobacco Use  . Smoking status: Former Smoker    Types: Cigarettes    Last attempt to quit: 07/21/2006    Years since quitting: 12.1  . Smokeless tobacco: Never Used  Substance Use Topics  . Alcohol use: Yes    Comment: 2-3 day  . Drug use: No     Allergies   Patient has no known allergies.   Review of Systems Review of Systems  Constitutional: Positive for fever. Negative for chills.  HENT: Negative for ear pain and sore throat.   Eyes: Negative for pain and visual disturbance.  Respiratory: Negative for cough and shortness of breath.   Cardiovascular: Negative for chest pain and palpitations.  Gastrointestinal: Positive for abdominal pain, blood in stool, diarrhea, nausea and vomiting.  Genitourinary: Negative for dysuria and hematuria.  Musculoskeletal: Negative for arthralgias and back pain.  Skin: Negative for color change and rash.  Neurological: Negative for seizures and syncope.  All other systems reviewed and are negative.    Physical Exam Updated Vital Signs BP 138/66   Pulse 86   Temp 99 F (37.2 C) (Oral)   Resp (!) 26   Ht 5\' 10"  (1.778 m)   Wt 90.7 kg   SpO2 100%   BMI 28.70 kg/m   Physical Exam Vitals signs and nursing note reviewed.  Constitutional:      Appearance: He is well-developed.     Comments: Non-ill appearing.   HENT:     Head: Normocephalic and atraumatic.     Comments: Oropharynx is clear.     Mouth/Throat:     Mouth: Mucous membranes are moist.  Eyes:     General: No scleral icterus.    Pupils: Pupils are equal, round, and reactive to light.  Neck:     Musculoskeletal: Normal range of motion.  Cardiovascular:     Rate and Rhythm: Normal rate.     Heart sounds: Normal heart  sounds.  Pulmonary:     Effort: Pulmonary effort is normal.     Breath sounds: Normal breath sounds. No wheezing.     Comments: Lungs are clear to auscultation.  Chest:     Chest wall: No tenderness.  Abdominal:     General: Bowel sounds are normal. There is no distension.     Palpations: Abdomen is soft.     Tenderness: There is abdominal tenderness. There is no right CVA tenderness or left  CVA tenderness.  Musculoskeletal:        General: No tenderness or deformity.  Skin:    General: Skin is warm and dry.  Neurological:     Mental Status: He is alert and oriented to person, place, and time. Mental status is at baseline.     Comments: Alert, oriented, thought content appropriate. Speech fluent without evidence of aphasia. Able to follow 2 step commands without difficulty.  Cranial Nerves:  II:  Peripheral visual fields grossly normal, pupils, round, reactive to light III,IV, VI: ptosis not present, extra-ocular motions intact bilaterally  V,VII: smile symmetric, facial light touch sensation equal VIII: hearing grossly normal bilaterally  IX,X: midline uvula rise  XI: bilateral shoulder shrug equal and strong XII: midline tongue extension  Motor:  5/5 in upper and lower extremities bilaterally including strong and equal grip strength and dorsiflexion/plantar flexion Sensory: light touch normal in all extremities.  Cerebellar: normal finger-to-nose with bilateral upper extremities, pronator drift negative Gait: normal gait and balance       ED Treatments / Results  Labs (all labs ordered are listed, but only abnormal results are displayed) Labs Reviewed  COMPREHENSIVE METABOLIC PANEL - Abnormal; Notable for the following components:      Result Value   Potassium 5.7 (*)    Chloride 95 (*)    CO2 18 (*)    Glucose, Bld 145 (*)    BUN 73 (*)    Creatinine, Ser 6.80 (*)    GFR calc non Af Amer 8 (*)    GFR calc Af Amer 9 (*)    Anion gap 22 (*)    All other components  within normal limits  CBC - Abnormal; Notable for the following components:   WBC 14.7 (*)    All other components within normal limits  URINALYSIS, ROUTINE W REFLEX MICROSCOPIC - Abnormal; Notable for the following components:   APPearance HAZY (*)    Glucose, UA >=500 (*)    Hgb urine dipstick SMALL (*)    Protein, ur 100 (*)    Bacteria, UA RARE (*)    All other components within normal limits  HEMOGLOBIN A1C - Abnormal; Notable for the following components:   Hgb A1c MFr Bld 8.3 (*)    All other components within normal limits  MAGNESIUM - Abnormal; Notable for the following components:   Magnesium 2.5 (*)    All other components within normal limits  LIPASE, BLOOD  BRAIN NATRIURETIC PEPTIDE  CBG MONITORING, ED    EKG EKG Interpretation  Date/Time:  Tuesday September 28 2018 17:41:28 EDT Ventricular Rate:  84 PR Interval:    QRS Duration: 109 QT Interval:  362 QTC Calculation: 428 R Axis:   99 Text Interpretation:  Sinus rhythm Probable left atrial enlargement Right axis deviation Borderline low voltage, extremity leads Minimal ST elevation, anterior leads No old tracing to compare Confirmed by Noemi Chapel [id number]) on 09/28/2018 5:57:10 PM   Radiology No results found.  Procedures Procedures (including critical care time)  Medications Ordered in ED Medications  sodium chloride 0.9 % bolus 500 mL (500 mLs Intravenous New Bag/Given 09/28/18 1837)     Initial Impression / Assessment and Plan / ED Course  I have reviewed the triage vital signs and the nursing notes.  Pertinent labs & imaging results that were available during my care of the patient were reviewed by me and considered in my medical decision making (see chart for details).      Patient with a  previous history of diabetes type 2 presents to the ED with nausea, vomiting, abdominal pain.  Seen by PCP today with laboratory results which showed a creatinine level of 6.8 this is increased from his previous  labs recorded which had a normal level.  Reports some weight gain, dark stools in the past few weeks, no other changes.  Denies any previous cardiac history, does report her mother and father did suffer from heart failure.  Patient used to smoke for 10 years but quit few years ago.  Denies any alcohol use.  CMP was remarkable for hyperkalemia, BUN is 73, creatinine level 6.80 anion gap is 22 today.  CBC shows slight leukocytosis, lipase level is within normal limits.  BNP was added to the lab testing along with magnesium level.  EKG showed no evaded T waves, EKG changes consistent with hyperkalemia this time. Will place nephrology consult for their recommendations.  Patient provided with 500 ml fluids.   6:16 PM Bladder Scan showed around 200 ml of urine, likely obstructed spoke to Dr. Jimmy Footman who recommended foley for placement. No other recommendations at this time.   Hemoglobin A1c showed 8.3, BNP was stable.  Magnesium level was slightly elevated at 2.5, CBG was within normal limits.  Chest x-ray pending.  Will place call for hospitalist admission.  7:52 PM spoke to Dr. Hal Hope who will evaluate and admit patient for further treatment of his acute kidney failure. Final Clinical Impressions(s) / ED Diagnoses   Final diagnoses:  Acute renal failure, unspecified acute renal failure type ([place])  Non-intractable vomiting with nausea, unspecified vomiting type    ED Discharge Orders    None       Janeece Fitting, PA-C 09/28/18 1953    Noemi Chapel, MD 09/29/18 1008

## 2018-09-28 NOTE — ED Provider Notes (Signed)
Medical screening examination/treatment/procedure(s) were conducted as a shared visit with non-physician practitioner(s) and myself.  I personally evaluated the patient during the encounter.  Clinical Impression:   Final diagnoses:  Acute renal failure, unspecified acute renal failure type ([facility])  Non-intractable vomiting with nausea, unspecified vomiting type   The patient is a 63 year old male, he reports a history of diabetes, he takes metformin but has had some variable blood sugars of recent.  He also reports a history of anxiety and hyperlipidemia.  The patient reportedly developed some nausea over the last 48 hours and went to see his doctor yesterday because of this nausea.  Lab work was done and the patient was called and told to come to the hospital because of severe kidney failure.  The patient denies any recent strep throat or other illnesses, has had no diarrhea or rectal bleeding, denies chest pain abdominal pain or shortness of breath.  He does have some persistent nausea and comments on the difficulty urinating where he states he has to go frequently but is not passing large amounts of urine.  On my exam he has a soft nontender abdomen though he does have some slight discomfort in the suprapubic region.  Bedside ultrasound reveals a small to moderate amount of urine.  He has clear heart and lung sounds, he is not in any distress with a normal mental status.  Labs show creatinine of over 6, BUN up around 70, leukocytosis.  Urine pending, patient appears medically stable from a hemodynamic standpoint but will need to have nephrology consultation at admission to the hospital.   Noemi Chapel, MD 09/29/18 1008

## 2018-09-28 NOTE — ED Notes (Signed)
ED TO INPATIENT HANDOFF REPORT  ED Nurse Name and Phone #: Suezanne Jacquet [phone]  S Name/Age/Gender Shawn Yu 63 y.o. male Room/Bed: 034C/034C  Code Status   Code Status: Full Code  Home/SNF/Other Home Patient oriented to: self, place, time and situation Is this baseline? Yes   Triage Complete: Triage complete  Chief Complaint poss kidney failure/sent by dr  Triage Note Pt reports he has been nauseated for the past 2 days. Pt reports going to his PCP today and being told he had kidney failure and needed to come to the hospital.    Allergies No Known Allergies  Level of Care/Admitting Diagnosis ED Disposition    ED Disposition Condition [place]: [place] [[id number]]  Level of Care: Medical Telemetry [104]  Diagnosis: ARF (acute renal failure) [facility]) [[id number]]  Admitting Physician: Rise Patience [phone]  Attending Physician: Rise Patience [phone]  Estimated length of stay: past midnight tomorrow  Certification:: I certify this patient will need inpatient services for at least 2 midnights  PT Class (Do Not Modify): Inpatient [101]  PT Acc Code (Do Not Modify): Private [1]       B Medical/Surgery History Past Medical History:  Diagnosis Date  . Abnormal EKG 3-4 yrs ago  . Anxiety   . Chest pain, atypical 3 4 yrs ago   stres test normal  . Depression   . Diverticulosis   . DM (diabetes mellitus) ([place])   . Erectile dysfunction    4 weeks ago  . GERD (gastroesophageal reflux disease)   . Hyperlipidemia   . Hypertension   . Obesity    Past Surgical History:  Procedure Laterality Date  . COLONOSCOPY WITH PROPOFOL N/A 02/19/2016   Procedure: COLONOSCOPY WITH PROPOFOL;  Surgeon: Garlan Fair, MD;  Location: [facility];  Service: Endoscopy;  Laterality: N/A;  . colonscopy  2007     A IV Location/Drains/Wounds Patient Lines/Drains/Airways Status   Active Line/Drains/Airways    Name:   Placement date:    Placement time:   Site:   Days:   Peripheral IV 09/28/18 Right Antecubital   09/28/18    1751    Antecubital   less than 1   Urethral Catheter Ben, RN Latex 16 Fr.   09/28/18    1958    Latex   less than 1          Intake/Output Last 24 hours No intake or output data in the 24 hours ending 09/28/18 2337  Labs/Imaging Results for orders placed or performed during the hospital encounter of 09/28/18 (from the past 48 hour(s))  Lipase, blood     Status: None   Collection Time: 09/28/18  1:43 PM  Result Value Ref Range   Lipase 41 11 - 51 U/L    Comment: Performed at [facility], [place] [address] [place], [place] [address]  Comprehensive metabolic panel     Status: Abnormal   Collection Time: 09/28/18  1:43 PM  Result Value Ref Range   Sodium 135 135 - 145 mmol/L   Potassium 5.7 (H) 3.5 - 5.1 mmol/L   Chloride 95 (L) 98 - 111 mmol/L   CO2 18 (L) 22 - 32 mmol/L   Glucose, Bld 145 (H) 70 - 99 mg/dL   BUN 73 (H) 8 - 23 mg/dL   Creatinine, Ser 6.80 (H) 0.61 - 1.24 mg/dL   Calcium 9.2 8.9 - 10.3 mg/dL   Total Protein 6.6 6.5 -  8.1 g/dL   Albumin 3.9 3.5 - 5.0 g/dL   AST 29 15 - 41 U/L   ALT 25 0 - 44 U/L   Alkaline Phosphatase 55 38 - 126 U/L   Total Bilirubin 1.0 0.3 - 1.2 mg/dL   GFR calc non Af Amer 8 (L) >60 mL/min   GFR calc Af Amer 9 (L) >60 mL/min   Anion gap 22 (H) 5 - 15    Comment: Performed at [facility] [address], [place], [place] [address]  CBC     Status: Abnormal   Collection Time: 09/28/18  1:43 PM  Result Value Ref Range   WBC 14.7 (H) 4.0 - 10.5 K/uL   RBC 4.93 4.22 - 5.81 MIL/uL   Hemoglobin 14.5 13.0 - 17.0 g/dL   HCT 45.5 39.0 - 52.0 %   MCV 92.3 80.0 - 100.0 fL   MCH 29.4 26.0 - 34.0 pg   MCHC 31.9 30.0 - 36.0 g/dL   RDW 13.6 11.5 - 15.5 %   Platelets 307 150 - 400 K/uL   nRBC 0.0 0.0 - 0.2 %    Comment: Performed at [facility], [place] [address] [place], [place] [address]  Magnesium     Status: Abnormal   Collection Time:  09/28/18  5:32 PM  Result Value Ref Range   Magnesium 2.5 (H) 1.7 - 2.4 mg/dL    Comment: Performed at [facility] [address], [place], [place] [address]  Urinalysis, Routine w reflex microscopic     Status: Abnormal   Collection Time: 09/28/18  5:42 PM  Result Value Ref Range   Color, Urine YELLOW YELLOW   APPearance HAZY (A) CLEAR   Specific Gravity, Urine 1.016 1.005 - 1.030   pH 5.0 5.0 - 8.0   Glucose, UA >=500 (A) NEGATIVE mg/dL   Hgb urine dipstick SMALL (A) NEGATIVE   Bilirubin Urine NEGATIVE NEGATIVE   Ketones, ur NEGATIVE NEGATIVE mg/dL   Protein, ur 100 (A) NEGATIVE mg/dL   Nitrite NEGATIVE NEGATIVE   Leukocytes,Ua NEGATIVE NEGATIVE   RBC / HPF 0-5 0 - 5 RBC/hpf   WBC, UA 6-10 0 - 5 WBC/hpf   Bacteria, UA RARE (A) NONE SEEN   Squamous Epithelial / LPF 0-5 0 - 5    Comment: Performed at [facility], [address] [place], [place] [address]  Brain natriuretic peptide     Status: None   Collection Time: 09/28/18  5:42 PM  Result Value Ref Range   B Natriuretic Peptide 96.8 0.0 - 100.0 pg/mL    Comment: Performed at [facility] [address] [place], [place] [address]  Hemoglobin A1c     Status: Abnormal   Collection Time: 09/28/18  5:42 PM  Result Value Ref Range   Hgb A1c MFr Bld 8.3 (H) 4.8 - 5.6 %    Comment: (NOTE) Pre diabetes:          5.7%-6.4% Diabetes:              >6.4% Glycemic control for   <7.0% adults with diabetes    Mean Plasma Glucose 191.51 mg/dL    Comment: Performed at [facility] [address] [place], [place] [address]  CBG monitoring, ED     Status: None   Collection Time: 09/28/18  6:44 PM  Result Value Ref Range   Glucose-Capillary 85 70 - 99 mg/dL   Dg Chest 2 View  Result Date: 09/28/2018 CLINICAL DATA:  Chest pain EXAM: CHEST - 2 VIEW COMPARISON:  04/28/2016 FINDINGS: Small right pleural effusion with basilar atelectasis. Lungs are otherwise clear. Normal cardiomediastinal contours. IMPRESSION:  Small right pleural effusion with associated atelectasis. Electronically Signed   By: Ulyses Jarred M.D.   On: 09/28/2018 18:54   US Renal  Result Date: 09/28/2018 CLINICAL DATA:  Acute renal insufficiency. EXAM: RENAL / URINARY TRACT ULTRASOUND COMPLETE COMPARISON:  None. FINDINGS: Right Kidney: Renal measurements: 12.7 by 5.7 by 6.1 = volume: 231 mL . Echogenicity within normal limits. No mass or hydronephrosis visualized. Left Kidney: Renal measurements: 12.8 by 6.1 by 6.4 cm = volume: 268 mL. Echogenicity within normal limits. No mass or hydronephrosis visualized. Bladder: Not evaluated, decompressed around a Foley catheter. IMPRESSION: Normal appearance of the kidneys. Electronically Signed   By: Fidela Salisbury M.D.   On: 09/28/2018 21:25    Pending Labs Unresulted Labs (From admission, onward)    Start     Ordered   09/29/18 0500  Renal function panel  Daily,   R     09/28/18 1920   09/29/18 8937  Basic metabolic panel  Tomorrow morning,   R     09/28/18 2106   09/29/18 0500  CBC  Tomorrow morning,   R     09/28/18 2106   09/28/18 2058  HIV antibody (Routine Testing)  Once,   R     09/28/18 2106   09/28/18 1924  Antistreptolysin O titer  Once,   R     09/28/18 1923   09/28/18 1924  C3 complement  Once,   R     09/28/18 1923   09/28/18 1924  C4 complement  Once,   R     09/28/18 1923   09/28/18 1921  CK  Once,   R     09/28/18 1920   09/28/18 1921  Uric acid  Once,   R     09/28/18 1920   09/28/18 1921  Sodium, urine, random  Once,   R     09/28/18 1920   09/28/18 1921  Creatinine, urine, random  Once,   R     09/28/18 1920   09/28/18 1921  Protein electrophoresis, serum  Once,   R     09/28/18 1920   09/28/18 1921  ANA, IFA (with reflex)  Once,   R     09/28/18 1920   09/28/18 1921  Mpo/pr-3 (anca) antibodies  Once,   R     09/28/18 1920   09/28/18 1921  Hepatitis c antibody (reflex)  Once,   R     09/28/18 1920   09/28/18 1921  Hepatitis B surface  antibody,qualitative  Once,   R     09/28/18 1920   09/28/18 1921  Hepatitis B surface antigen  Once,   R     09/28/18 1920   09/28/18 1921  Parathyroid hormone, intact (no Ca)  Once,   R     09/28/18 1920   09/28/18 1921  Protein / creatinine ratio, urine  Once,   R     09/28/18 1920          Vitals/Pain Today's Vitals   09/28/18 2200 09/28/18 2215 09/28/18 2230 09/28/18 2253  BP:      Pulse: 91 85 86   Resp:   (!) 22   Temp:      TempSrc:      SpO2: 100% 100% 100%   Weight:  Height:      PainSc:    0-No pain    Isolation Precautions No active isolations  Medications Medications  sodium bicarbonate tablet 1,300 mg (1,300 mg Oral Given 09/28/18 2217)  amLODipine (NORVASC) tablet 10 mg (10 mg Oral Given 09/28/18 2217)  buPROPion (WELLBUTRIN XL) 24 hr tablet 300 mg (has no administration in time range)  acetaminophen (TYLENOL) tablet 650 mg (650 mg Oral Given 09/28/18 2217)    Or  acetaminophen (TYLENOL) suppository 650 mg ( Rectal See Alternative 09/28/18 2217)  insulin aspart (novoLOG) injection 0-9 Units (has no administration in time range)  hydrALAZINE (APRESOLINE) injection 5 mg (has no administration in time range)  zolpidem (AMBIEN) tablet 5 mg (has no administration in time range)  sodium chloride 0.9 % bolus 500 mL (0 mLs Intravenous Stopped 09/28/18 1959)    Mobility walks Low fall risk   Focused Assessments Renal Assessment Handoff:  Kidney Labs    Creatinine 6.80 BUN 73 K+ 5.7    R Recommendations: See Admitting Provider Note  Report given to:   Additional Notes:  No hx of kidney problems.

## 2018-09-28 NOTE — H&P (Signed)
History and Physical    Shawn Yu:[id number] DOB: [birth year] DOA: 09/28/2018  PCP: Wenda Low, MD  Patient coming from: Home.  Chief Complaint: Abnormal labs.  HPI: [name] is a 63 y.o. male with history of diabetes mellitus type 2, hypertension, hyperlipidemia was referred to the ER after patient labs showed acute renal failure.  Patient states that he had labs drawn about 3 months ago by primary care physician which was normal as per the patient.  He had routine labs drawn and showed increased creatinine and was advised to come to the ER.  Patient states over the last 3 to 4 days has been having some nausea and vomiting.  Vague abdominal discomfort in the left upper quadrant denies any diarrhea.  Denies any chest pain or shortness of breath.  Has not had any change in his medications.  ED Course: In the ER patient's creatinine was 6.8 and in epic in 2017 was normal.  Potassium 5.7 EKG shows normal sinus rhythm with nonspecific ST-T changes particular in the anterior leads.  Anion gap was 22 bicarb was 18.  Hemoglobin is normal.  Chest x-ray shows some right pleural effusion.  On-call nephrology was consulted and patient admitted for further management of acute renal failure.  Renal sonogram does not show any obstruction.  UA shows 6-10 WBCs no RBCs and no casts.  Protein was 100.  Review of Systems: As per HPI, rest all negative.   Past Medical History:  Diagnosis Date  . Abnormal EKG 3-4 yrs ago  . Anxiety   . Chest pain, atypical 3 4 yrs ago   stres test normal  . Depression   . Diverticulosis   . DM (diabetes mellitus) ([place])   . Erectile dysfunction    4 weeks ago  . GERD (gastroesophageal reflux disease)   . Hyperlipidemia   . Hypertension   . Obesity     Past Surgical History:  Procedure Laterality Date  . COLONOSCOPY WITH PROPOFOL N/A 02/19/2016   Procedure: COLONOSCOPY WITH PROPOFOL;  Surgeon: Garlan Fair, MD;  [facility]: [facility];  Service:  Endoscopy;  Laterality: N/A;  . colonscopy  2007     reports that he quit smoking about 12 years ago. His smoking use included cigarettes. He has never used smokeless tobacco. He reports current alcohol use. He reports that he does not use drugs.  No Known Allergies  Family History  Problem Relation Age of Onset  . Hypertension Mother   . Diabetes Father   . Heart failure Father   . Hypertension Brother   . Hyperlipidemia Brother   . Cancer Brother   . Hyperlipidemia Brother   . HIV Brother     Prior to Admission medications   Medication Sig Start Date End Date Taking? Authorizing Provider  amLODipine (NORVASC) 10 MG tablet Take 1 tablet by mouth every evening.  12/21/15  Yes [provider]  atorvastatin (LIPITOR) 20 MG tablet Take 20 mg by mouth every evening.    Yes [provider]  BELSOMRA 15 MG TABS Take 15 mg by mouth at bedtime as needed for sleep. 08/24/18  Yes [provider]  buPROPion (WELLBUTRIN XL) 300 MG 24 hr tablet Take 300 mg by mouth daily. 09/02/18  Yes [provider]  busPIRone (BUSPAR) 15 MG tablet Take 1 tablet by mouth 3 (three) times daily.  12/21/15  Yes [provider]  estazolam (PROSOM) 2 MG tablet Take 2 mg by mouth at bedtime. sleep  07/26/15  Yes [provider]  JARDIANCE 25 MG TABS tablet Take 25 mg by mouth daily. 09/01/18  Yes [provider]  losartan (COZAAR) 50 MG tablet Take 50 mg by mouth every evening.    Yes [provider]  meloxicam (MOBIC) 15 MG tablet Take 15 mg by mouth daily as needed. Back pain 09/01/18  Yes [provider]  metFORMIN (GLUCOPHAGE) 1000 MG tablet Take 1 tablet by mouth 2 (two) times daily. 12/28/15  Yes [provider]  methocarbamol (ROBAXIN) 500 MG tablet Take 500 mg by mouth 2 (two) times daily as needed. Back pain 09/02/18  Yes [provider]  mirtazapine (REMERON) 15 MG tablet Take 15 mg by mouth at bedtime. 07/15/18  Yes  [provider]  omeprazole (PRILOSEC) 20 MG capsule Take 20 mg by mouth daily as needed (heartburn).    Yes [provider]  QUEtiapine (SEROQUEL) 100 MG tablet Take 100 mg by mouth at bedtime. 07/15/18  Yes [provider]  tamsulosin (FLOMAX) 0.4 MG CAPS capsule Take 0.4 mg by mouth daily as needed. ATN 08/23/18  Yes [provider]  ondansetron (ZOFRAN) 4 MG tablet Take 4 mg by mouth 4 (four) times daily as needed for nausea or vomiting.  09/28/18   [provider]  ONE TOUCH ULTRA TEST test strip CHECK BLOOD SUGAR 3 TIMES A DAY. 06/19/15   [provider]    Physical Exam: Vitals:   09/28/18 1945 09/28/18 2000 09/28/18 2015 09/28/18 2030  BP: 128/74 125/67 128/76 126/76  Pulse: 84 87  87  Resp: (!) 26 15 15 19   Temp:      TempSrc:      SpO2: 100% 99% 100% 100%  Weight:      Height:          Constitutional: Moderately built and nourished. Vitals:   09/28/18 1945 09/28/18 2000 09/28/18 2015 09/28/18 2030  BP: 128/74 125/67 128/76 126/76  Pulse: 84 87  87  Resp: (!) 26 15 15 19   Temp:      TempSrc:      SpO2: 100% 99% 100% 100%  Weight:      Height:       Eyes: Anicteric no pallor. ENMT: No discharge from the ears eyes nose and mouth. Neck: No mass felt.  No neck rigidity. Respiratory: No rhonchi or crepitations. Cardiovascular: S1-S2 heard. Abdomen: Soft nontender bowel sounds present. Musculoskeletal: No edema. Skin: No rash. Neurologic: Alert awake oriented to time place and person.  Moves all extremities. Psychiatric: Appears normal.  Normal affect.   Labs on Admission: I have personally reviewed following labs and imaging studies  CBC: Recent Labs  Lab 09/28/18 1343  WBC 14.7*  HGB 14.5  HCT 45.5  MCV 92.3  PLT 376   Basic Metabolic Panel: Recent Labs  Lab 09/28/18 1343 09/28/18 1732  NA 135  --   K 5.7*  --   CL 95*  --   CO2 18*  --   GLUCOSE 145*  --   BUN 73*  --   CREATININE 6.80*  --    CALCIUM 9.2  --   MG  --  2.5*   GFR: Estimated Creatinine Clearance: 12.8 mL/min (A) (by C-G formula based on SCr of 6.8 mg/dL (H)). Liver Function Tests: Recent Labs  Lab 09/28/18 1343  AST 29  ALT 25  ALKPHOS 55  BILITOT 1.0  PROT 6.6  ALBUMIN 3.9   Recent Labs  Lab 09/28/18 1343  LIPASE 41   No results for input(s): AMMONIA in the last 168 hours. Coagulation Profile: No results for input(s): INR, PROTIME in the last 168 hours. Cardiac Enzymes: No results for input(s): CKTOTAL, CKMB, CKMBINDEX, TROPONINI in the last 168 hours. BNP (last 3 results) No results for input(s): PROBNP in the last 8760 hours. HbA1C: Recent Labs    09/28/18 1742  HGBA1C 8.3*   CBG: Recent Labs  Lab 09/28/18 1844  GLUCAP 85   Lipid Profile: No results for input(s): CHOL, HDL, LDLCALC, TRIG, CHOLHDL, LDLDIRECT in the last 72 hours. Thyroid Function Tests: No results for input(s): TSH, T4TOTAL, FREET4, T3FREE, THYROIDAB in the last 72 hours. Anemia Panel: No results for input(s): VITAMINB12, FOLATE, FERRITIN, TIBC, IRON, RETICCTPCT in the last 72 hours. Urine analysis:    Component Value Date/Time   COLORURINE YELLOW 09/28/2018 1742   APPEARANCEUR HAZY (A) 09/28/2018 1742   LABSPEC 1.016 09/28/2018 1742   PHURINE 5.0 09/28/2018 1742   GLUCOSEU >=500 (A) 09/28/2018 1742   HGBUR SMALL (A) 09/28/2018 1742   BILIRUBINUR NEGATIVE 09/28/2018 1742   KETONESUR NEGATIVE 09/28/2018 1742   PROTEINUR 100 (A) 09/28/2018 1742   NITRITE NEGATIVE 09/28/2018 1742   LEUKOCYTESUR NEGATIVE 09/28/2018 1742   Sepsis Labs: @LABRCNTIP (procalcitonin:4,lacticidven:4) )No results found for this or any previous visit (from the past 240 hour(s)).   Radiological Exams on Admission: Dg Chest 2 View  Result Date: 09/28/2018 CLINICAL DATA:  Chest pain EXAM: CHEST - 2 VIEW COMPARISON:  04/28/2016 FINDINGS: Small right pleural effusion with basilar atelectasis. Lungs are otherwise clear. Normal  cardiomediastinal contours. IMPRESSION: Small right pleural effusion with associated atelectasis. Electronically Signed   By: Ulyses Jarred M.D.   On: 09/28/2018 18:54    EKG: Independently reviewed.  Normal sinus rhythm with nonspecific ST-T changes.  Assessment/Plan Principal Problem:   ARF (acute renal failure) (HCC) Active Problems:   Essential hypertension   DM2 (diabetes mellitus, type 2) ([place])    1. Acute renal failure appears to be nonoliguric -appreciate nephrology consult and recommendations.  Has mild uremic symptoms and has mild hyperkalemia.  Will hold losartan metformin omeprazole.  Patient states that he rarely takes Mobic.  Renal sonogram does not show any obstruction.  Oral intake output metabolic panel and labs ordered by nephrology. 2. Hypertension we will continue amlodipine hold losartan and keep patient on PRN IV hydralazine. 3. Diabetes mellitus type 2 we will hold Jardiance and metformin and keep patient on sliding scale coverage. 4. Nausea and abdominal discomfort likely from uremia.  Follow LFTs.  Abdomen appears benign.  If nausea persist may consider further imaging. 5. History of hyperlipidemia we will hold statins for now check CK levels. 6. History of depression on Wellbutrin.  Patient states he does not take his Seroquel or BuSpar.   DVT prophylaxis: SCDs now for notify patient of possible procedure for kidney.  If not may keep patient on heparin. Code Status: Full code. Family Communication: Discussed with patient. Disposition Plan: Home. Consults called: Nephrology. Admission status: Inpatient.   Rise Patience MD [facility] Pager [phone].  If 7PM-7AM, please contact night-coverage [email or web address] Password [email or web address]  09/28/2018, 9:07 PM

## 2018-09-28 NOTE — ED Notes (Signed)
Patient transported to Ultrasound 

## 2018-09-29 DIAGNOSIS — F329 Major depressive disorder, single episode, unspecified: Secondary | ICD-10-CM

## 2018-09-29 DIAGNOSIS — I10 Essential (primary) hypertension: Secondary | ICD-10-CM

## 2018-09-29 LAB — GLUCOSE, CAPILLARY
Glucose-Capillary: 83 mg/dL (ref 70–99)
Glucose-Capillary: 226 mg/dL — ABNORMAL HIGH (ref 70–99)
Glucose-Capillary: 175 mg/dL — ABNORMAL HIGH (ref 70–99)
Glucose-Capillary: 186 mg/dL — ABNORMAL HIGH (ref 70–99)
Glucose-Capillary: 108 mg/dL — ABNORMAL HIGH (ref 70–99)

## 2018-09-29 LAB — URIC ACID: Uric Acid, Serum: 9.1 mg/dL — ABNORMAL HIGH (ref 3.7–8.6)

## 2018-09-29 LAB — PROTEIN / CREATININE RATIO, URINE
Creatinine, Urine: 67.42 mg/dL
Protein Creatinine Ratio: 4.67 mg/mg{Cre} — ABNORMAL HIGH (ref 0.00–0.15)
Total Protein, Urine: 315 mg/dL

## 2018-09-29 LAB — RENAL FUNCTION PANEL
Albumin: 3.4 g/dL — ABNORMAL LOW (ref 3.5–5.0)
Anion gap: 21 — ABNORMAL HIGH (ref 5–15)
BUN: 87 mg/dL — ABNORMAL HIGH (ref 8–23)
CO2: 16 mmol/L — AB (ref 22–32)
Calcium: 8.4 mg/dL — ABNORMAL LOW (ref 8.9–10.3)
Chloride: 95 mmol/L — ABNORMAL LOW (ref 98–111)
Creatinine, Ser: 8.88 mg/dL — ABNORMAL HIGH (ref 0.61–1.24)
GFR calc Af Amer: 7 mL/min — ABNORMAL LOW (ref >60)
GFR calc non Af Amer: 6 mL/min — ABNORMAL LOW (ref >60)
Glucose, Bld: 242 mg/dL — ABNORMAL HIGH (ref 70–99)
Phosphorus: 9.9 mg/dL — ABNORMAL HIGH (ref 2.5–4.6)
Potassium: 5.4 mmol/L — ABNORMAL HIGH (ref 3.5–5.1)
Sodium: 132 mmol/L — ABNORMAL LOW (ref 135–145)

## 2018-09-29 LAB — CK: Total CK: 110 U/L (ref 49–397)

## 2018-09-29 LAB — CBC
HCT: 41.8 % (ref 39.0–52.0)
Hemoglobin: 13.9 g/dL (ref 13.0–17.0)
MCH: 29.5 pg (ref 26.0–34.0)
MCHC: 33.3 g/dL (ref 30.0–36.0)
MCV: 88.7 fL (ref 80.0–100.0)
PLATELETS: 270 10*3/uL (ref 150–400)
RBC: 4.71 MIL/uL (ref 4.22–5.81)
RDW: 13.7 % (ref 11.5–15.5)
WBC: 13 10*3/uL — ABNORMAL HIGH (ref 4.0–10.5)
nRBC: 0 % (ref 0.0–0.2)

## 2018-09-29 LAB — SODIUM, URINE, RANDOM: Sodium, Ur: 44 mmol/L

## 2018-09-29 LAB — CREATININE, URINE, RANDOM: Creatinine, Urine: 63.81 mg/dL

## 2018-09-29 MED ORDER — ZOLPIDEM TARTRATE 5 MG PO TABS
5.0000 mg | ORAL_TABLET | Freq: Every evening | ORAL | Status: DC | PRN
  Administered 2018-09-29 – 2018-09-30 (×2): 5 mg via ORAL
  Filled 2018-09-29 (×2): qty 1

## 2018-09-29 MED ORDER — SODIUM ZIRCONIUM CYCLOSILICATE 10 G PO PACK
10.0000 g | PACK | Freq: Every day | ORAL | Status: DC
  Administered 2018-09-29 – 2018-10-02 (×3): 10 g via ORAL
  Filled 2018-09-29 (×5): qty 1

## 2018-09-29 NOTE — Progress Notes (Signed)
Patient is experiencing bloody urine with foley intact had expressed that he has not been consistent with taking Flomax.

## 2018-09-29 NOTE — Progress Notes (Signed)
PROGRESS NOTE  Shawn Yu AOZ:[id number] DOB: October 07, 1955 DOA: 09/28/2018 PCP: Wenda Low, MD  HPI/Brief Narrative  Shawn Yu is a 63 y.o. year old male with medical history significant for diabetes mellitus type 2, hypertension, hyperlipidemia. He was referred to the ER by his PCP after labs showed elevated Cr. In the ER, he was found to have acute renal failure. His Cr was 6.8, previously normal 3 months ago per patient. Potassium was 5.7, and EKG showed changes consistent with hyperkalemia. CXR showed some right pleural effusion. UA showed 6-10 WBCs, no RBCs, no casts; protein of 100. Renal sonogram was performed and showed 227mL urine; foley was placed due to concern for obstruction. Nephrology was consulted, and patient was admitted for further management of his acute renal failure.   Subjective Prior to this hospitalization, pt reports a few days of nausea with occasional vomiting, hiccuping, and reflux; vague abdominal discomfort; and weight gain over the last few months. No recent changes in medications or regular NSAID use. He notes dark loose stool last night, but never any BRBPR. He endorses recent URI (~1 month ago). Endorses subjective fever. Denies chest pain, denies new back pain, denies dark urine. He urinates frequently at baseline, but denies any increase in frequency, no changes in urine color, no difficulty voiding.   This patient is a former smoker- smoked for 10 years, quit few years ago. Minimal alcohol use.   Assessment/plan Acute renal failure Cr 8.88 today, from 6.8 yesterday in ED; previously normal in 2019, per pt. This pt rarely takes NSAIDs, but does regularly take Losartan, Metformin, and Omeprazole. These medications are currently being held. No obstruction seen on renal US; Foley catheter in place for questionable obstruction. Continue to monitor I/Os, weight, potassium levels. Serologies for possible causation (ANCA, anti-GBM, HIV, Hepatitis, etc) are  still pending. Continue IV and oral fluids. For hyperkalemia, nephrology is starting him on Lokelma. Likely etiology of AKI is pt's longstanding Type II Diabetes. Per nephrology note, plan to proceed with renal biopsy to firmly diagnose etiology of AKI. This patient is also being followed by nephrology.  Type II Diabetes Last A1c 8.3 (09/28/2018). AKI of uncertain origin in the setting of diabetes (see above). Continue Jardiance, Metformin; sliding scale insulin. Encourage good blood sugar control.  Hyperkalemia Potassium at 5.4. Nephrology is starting pt on Lokelma.   Hypertension Continue amlodipine, but hold Losartan. Prn IV hydralazine. Encourage good BP control. Continue to push fluids.   Nausea and abdominal discomfort Pt with uremia secondary to AKI. Nausea and abdominal discomfort improving with treatment as above.   Hyperlipidemia CK level in normal range. Continue to hold statins for now.   History of depression Continue Wellbutrin.   DVT prophylaxis: SCDs now for notify patient of possible procedure for kidney.  If not may keep patient on heparin. Code Status: Full code. Family Communication: family not at bedside Disposition Plan: Home. Consults called: Nephrology. Admission status: Inpatient.  Objective: Vitals:   09/28/18 1329 09/29/18 0020 09/29/18 0530 09/29/18 1202  BP:  127/62 121/70 127/78  Pulse:  87 90 86  Resp:  14 16 18   Temp:  98.8 F (37.1 C) 99 F (37.2 C) 98.1 F (36.7 C)  TempSrc:  Oral Oral Oral  SpO2:  99% 98% 98%  Weight: 90.7 kg 89.6 kg    Height: 5\' 10"  (1.778 m) 5\' 10"  (1.778 m)      Intake/Output Summary (Last 24 hours) at 09/29/2018 1235 Last data filed at 09/29/2018 9629  Gross per 24 hour  Intake 1200 ml  Output 100 ml  Net 1100 ml   Filed Weights   09/28/18 1323 09/28/18 1329 09/29/18 0020  Weight: 90.7 kg 90.7 kg 89.6 kg    Exam: Constitutional:normal appearing male, in no acute distress.  HEENT: grossly  normal Cardiovascular: RRR no MRGs, with no peripheral edema Respiratory: Normal respiratory effort, clear breath sounds  Abdomen: Soft,non-tender, with no HSM, no distension Skin: No rash ulcers, or lesions. Without skin tenting  Neurologic: Grossly no focal neuro deficit. Psychiatric:Appropriate affect, and mood.   Data Reviewed: CBC: Recent Labs  Lab 09/28/18 1343 09/29/18 0508  WBC 14.7* 13.0*  HGB 14.5 13.9  HCT 45.5 41.8  MCV 92.3 88.7  PLT 307 629   Basic Metabolic Panel: Recent Labs  Lab 09/28/18 1343 09/28/18 1732 09/29/18 0508  NA 135  --  132*  K 5.7*  --  5.4*  CL 95*  --  95*  CO2 18*  --  16*  GLUCOSE 145*  --  242*  BUN 73*  --  87*  CREATININE 6.80*  --  8.88*  CALCIUM 9.2  --  8.4*  MG  --  2.5*  --   PHOS  --   --  9.9*   GFR: Estimated Creatinine Clearance: 9.7 mL/min (A) (by C-G formula based on SCr of 8.88 mg/dL (H)). Liver Function Tests: Recent Labs  Lab 09/28/18 1343 09/29/18 0508  AST 29  --   ALT 25  --   ALKPHOS 55  --   BILITOT 1.0  --   PROT 6.6  --   ALBUMIN 3.9 3.4*   Recent Labs  Lab 09/28/18 1343  LIPASE 41   No results for input(s): AMMONIA in the last 168 hours. Coagulation Profile: No results for input(s): INR, PROTIME in the last 168 hours. Cardiac Enzymes: Recent Labs  Lab 09/28/18 2337  CKTOTAL 110   BNP (last 3 results) No results for input(s): PROBNP in the last 8760 hours. HbA1C: Recent Labs    09/28/18 1742  HGBA1C 8.3*   CBG: Recent Labs  Lab 09/28/18 1844 09/29/18 0200 09/29/18 0614 09/29/18 1154  GLUCAP 85 83 226* 175*   Lipid Profile: No results for input(s): CHOL, HDL, LDLCALC, TRIG, CHOLHDL, LDLDIRECT in the last 72 hours. Thyroid Function Tests: No results for input(s): TSH, T4TOTAL, FREET4, T3FREE, THYROIDAB in the last 72 hours. Anemia Panel: No results for input(s): VITAMINB12, FOLATE, FERRITIN, TIBC, IRON, RETICCTPCT in the last 72 hours. Urine analysis:    Component Value  Date/Time   COLORURINE YELLOW 09/28/2018 1742   APPEARANCEUR HAZY (A) 09/28/2018 1742   LABSPEC 1.016 09/28/2018 1742   PHURINE 5.0 09/28/2018 1742   GLUCOSEU >=500 (A) 09/28/2018 1742   HGBUR SMALL (A) 09/28/2018 1742   BILIRUBINUR NEGATIVE 09/28/2018 1742   KETONESUR NEGATIVE 09/28/2018 1742   PROTEINUR 100 (A) 09/28/2018 1742   NITRITE NEGATIVE 09/28/2018 1742   LEUKOCYTESUR NEGATIVE 09/28/2018 1742   Sepsis Labs: @LABRCNTIP (procalcitonin:4,lacticidven:4)  )No results found for this or any previous visit (from the past 240 hour(s)).    Studies: Dg Chest 2 View  Result Date: 09/28/2018 CLINICAL DATA:  Chest pain EXAM: CHEST - 2 VIEW COMPARISON:  04/28/2016 FINDINGS: Small right pleural effusion with basilar atelectasis. Lungs are otherwise clear. Normal cardiomediastinal contours. IMPRESSION: Small right pleural effusion with associated atelectasis. Electronically Signed   By: Ulyses Jarred M.D.   On: 09/28/2018 18:54   US Renal  Result Date: 09/28/2018 CLINICAL DATA:  Acute renal insufficiency. EXAM: RENAL / URINARY TRACT ULTRASOUND COMPLETE COMPARISON:  None. FINDINGS: Right Kidney: Renal measurements: 12.7 by 5.7 by 6.1 = volume: 231 mL . Echogenicity within normal limits. No mass or hydronephrosis visualized. Left Kidney: Renal measurements: 12.8 by 6.1 by 6.4 cm = volume: 268 mL. Echogenicity within normal limits. No mass or hydronephrosis visualized. Bladder: Not evaluated, decompressed around a Foley catheter. IMPRESSION: Normal appearance of the kidneys. Electronically Signed   By: Fidela Salisbury M.D.   On: 09/28/2018 21:25    Scheduled Meds:  amLODipine  10 mg Oral QPM   buPROPion  300 mg Oral Daily   insulin aspart  0-9 Units Subcutaneous TID WC   sodium bicarbonate  1,300 mg Oral BID   sodium zirconium cyclosilicate  10 g Oral Daily    Continuous Infusions:   LOS: 1 day    Marin Roberts, PA-S 09/29/18

## 2018-09-29 NOTE — Consult Note (Signed)
Chief Complaint: Patient was seen in consultation today for random renal biopsy  Referring Physician(s): Vanessa [place], PA-C/Dr. Justin Mend  Supervising Physician: Sandi Mariscal  Patient Status: [name] - In-pt  History of Present Illness: Shawn Yu is a 63 y.o. male with a past medical history significant for anxiety, depression, atypical chest pain, HLD, HTN, GERD, diverticulosis and DM who presented to [facility] ED on 09/28/18 from his PCP's office due to abnormal labs. His creatinine was noted to be 6.8 (previously 1.0 last year), he has not history of kidney disease. He endorsed nausea, vomiting, abdominal pain, dark colored diarrhea, poor PO intake, hiccups, subjective fever and unintentional weight gain for several weeks. Nephrology was consulted for AKI - recommended holding losartan, metformin and omeprazole, placing foley and admitting for further evaluation and management. Request has been made to IR for random renal biopsy for further evaluation.   Patient reports he feels better than when he first came to the hospital, catheter is uncomfortable but otherwise feels fine. He denies any recent nausea, vomiting or diarrhea. He is concerned that his kidneys will not heal and he will need dialysis in the future. He understands that a kidney biopsy has been requested for further evaluation and he wishes to proceed.   Past Medical History:  Diagnosis Date  . Abnormal EKG 3-4 yrs ago  . Anxiety   . Chest pain, atypical 3 4 yrs ago   stres test normal  . Depression   . Diverticulosis   . DM (diabetes mellitus) ([place])   . Erectile dysfunction    4 weeks ago  . GERD (gastroesophageal reflux disease)   . Hyperlipidemia   . Hypertension   . Obesity     Past Surgical History:  Procedure Laterality Date  . COLONOSCOPY WITH PROPOFOL N/A 02/19/2016   Procedure: COLONOSCOPY WITH PROPOFOL;  Surgeon: Garlan Fair, MD;  Location: [facility] ENDOSCOPY;  Service: Endoscopy;  Laterality: N/A;  . colonscopy   2007    Allergies: Patient has no known allergies.  Medications: Prior to Admission medications   Medication Sig Start Date End Date Taking? Authorizing Provider  amLODipine (NORVASC) 10 MG tablet Take 1 tablet by mouth every evening.  12/21/15  Yes [provider]  atorvastatin (LIPITOR) 20 MG tablet Take 20 mg by mouth every evening.    Yes [provider]  BELSOMRA 15 MG TABS Take 15 mg by mouth at bedtime as needed for sleep. 08/24/18  Yes [provider]  buPROPion (WELLBUTRIN XL) 300 MG 24 hr tablet Take 300 mg by mouth daily. 09/02/18  Yes [provider]  busPIRone (BUSPAR) 15 MG tablet Take 1 tablet by mouth 3 (three) times daily.  12/21/15  Yes [provider]  estazolam (PROSOM) 2 MG tablet Take 2 mg by mouth at bedtime. sleep 07/26/15  Yes [provider]  JARDIANCE 25 MG TABS tablet Take 25 mg by mouth daily. 09/01/18  Yes [provider]  losartan (COZAAR) 50 MG tablet Take 50 mg by mouth every evening.    Yes [provider]  meloxicam (MOBIC) 15 MG tablet Take 15 mg by mouth daily as needed. Back pain 09/01/18  Yes [provider]  metFORMIN (GLUCOPHAGE) 1000 MG tablet Take 1 tablet by mouth 2 (two) times daily. 12/28/15  Yes [provider]  methocarbamol (ROBAXIN) 500 MG tablet Take 500 mg by mouth 2 (two) times daily as needed. Back pain 09/02/18  Yes [provider]  mirtazapine (REMERON) 15 MG tablet  Take 15 mg by mouth at bedtime. 07/15/18  Yes [provider]  omeprazole (PRILOSEC) 20 MG capsule Take 20 mg by mouth daily as needed (heartburn).    Yes [provider]  QUEtiapine (SEROQUEL) 100 MG tablet Take 100 mg by mouth at bedtime. 07/15/18  Yes [provider]  tamsulosin (FLOMAX) 0.4 MG CAPS capsule Take 0.4 mg by mouth daily as needed. ATN 08/23/18  Yes [provider]  ondansetron (ZOFRAN) 4 MG tablet Take 4 mg by mouth 4 (four) times daily as  needed for nausea or vomiting.  09/28/18   [provider]  ONE TOUCH ULTRA TEST test strip CHECK BLOOD SUGAR 3 TIMES A DAY. 06/19/15   [provider]     Family History  Problem Relation Age of Onset  . Hypertension Mother   . Diabetes Father   . Heart failure Father   . Hypertension Brother   . Hyperlipidemia Brother   . Cancer Brother   . Hyperlipidemia Brother   . HIV Brother     Social History   Socioeconomic History  . Marital status: Single    Spouse name: Not on file  . Number of children: Not on file  . Years of education: Not on file  . Highest education level: Not on file  Occupational History  . Not on file  Social Needs  . Financial resource strain: Not on file  . Food insecurity:    Worry: Not on file    Inability: Not on file  . Transportation needs:    Medical: Not on file    Non-medical: Not on file  Tobacco Use  . Smoking status: Former Smoker    Types: Cigarettes    Last attempt to quit: 07/21/2006    Years since quitting: 12.2  . Smokeless tobacco: Never Used  Substance and Sexual Activity  . Alcohol use: Yes    Comment: 2-3 day  . Drug use: No  . Sexual activity: Not on file  Lifestyle  . Physical activity:    Days per week: Not on file    Minutes per session: Not on file  . Stress: Not on file  Relationships  . Social connections:    Talks on phone: Not on file    Gets together: Not on file    Attends religious service: Not on file    Active member of club or organization: Not on file    Attends meetings of clubs or organizations: Not on file    Relationship status: Not on file  Other Topics Concern  . Not on file  Social History Narrative  . Not on file     Review of Systems: A 12 point ROS discussed and pertinent positives are indicated in the HPI above.  All other systems are negative.  Review of Systems  Constitutional: Positive for appetite change (Decreased), fatigue and unexpected weight change  (Increased). Negative for chills and fever (None currently).  Respiratory: Negative for cough and shortness of breath.   Cardiovascular: Negative for chest pain.  Gastrointestinal: Negative for abdominal pain, diarrhea, nausea and vomiting.  Musculoskeletal: Negative for back pain.  Skin: Negative for color change.  Neurological: Negative for headaches.    Vital Signs: BP 127/78 (BP Location: Left Arm)   Pulse 86   Temp 98.1 F (36.7 C) (Oral)   Resp 18   Ht _0  (1.778 m)   Wt 197 lb 8 oz (89.6 kg) Comment: Scale B  SpO2 98%  BMI 28.34 kg/m   Physical Exam Vitals signs and nursing note reviewed.  Constitutional:      General: He is not in acute distress. HENT:     Head: Normocephalic.  Cardiovascular:     Rate and Rhythm: Normal rate and regular rhythm.  Pulmonary:     Effort: Pulmonary effort is normal.     Breath sounds: Normal breath sounds.  Abdominal:     General: There is no distension.     Palpations: Abdomen is soft.     Tenderness: There is no abdominal tenderness.  Skin:    General: Skin is warm and dry.  Neurological:     Mental Status: He is alert and oriented to person, place, and time.  Psychiatric:        Mood and Affect: Mood normal.        Behavior: Behavior normal.        Thought Content: Thought content normal.        Judgment: Judgment normal.      MD Evaluation Airway: WNL Heart: WNL Abdomen: WNL Chest/ Lungs: WNL ASA  Classification: 3 Mallampati/Airway Score: Two   Imaging: Dg Chest 2 View  Result Date: 09/28/2018 CLINICAL DATA:  Chest pain EXAM: CHEST - 2 VIEW COMPARISON:  04/28/2016 FINDINGS: Small right pleural effusion with basilar atelectasis. Lungs are otherwise clear. Normal cardiomediastinal contours. IMPRESSION: Small right pleural effusion with associated atelectasis. Electronically Signed   By: Ulyses Jarred M.D.   On: 09/28/2018 18:54   US Renal  Result Date: 09/28/2018 CLINICAL DATA:  Acute renal insufficiency.  EXAM: RENAL / URINARY TRACT ULTRASOUND COMPLETE COMPARISON:  None. FINDINGS: Right Kidney: Renal measurements: 12.7 by 5.7 by 6.1 = volume: 231 mL . Echogenicity within normal limits. No mass or hydronephrosis visualized. Left Kidney: Renal measurements: 12.8 by 6.1 by 6.4 cm = volume: 268 mL. Echogenicity within normal limits. No mass or hydronephrosis visualized. Bladder: Not evaluated, decompressed around a Foley catheter. IMPRESSION: Normal appearance of the kidneys. Electronically Signed   By: Fidela Salisbury M.D.   On: 09/28/2018 21:25    Labs:  CBC: Recent Labs    09/28/18 1343 09/29/18 0508  WBC 14.7* 13.0*  HGB 14.5 13.9  HCT 45.5 41.8  PLT 307 270    COAGS: No results for input(s): INR, APTT in the last 8760 hours.  BMP: Recent Labs    09/28/18 1343 09/29/18 0508  NA 135 132*  K 5.7* 5.4*  CL 95* 95*  CO2 18* 16*  GLUCOSE 145* 242*  BUN 73* 87*  CALCIUM 9.2 8.4*  CREATININE 6.80* 8.88*  GFRNONAA 8* 6*  GFRAA 9* 7*    LIVER FUNCTION TESTS: Recent Labs    09/28/18 1343 09/29/18 0508  BILITOT 1.0  --   AST 29  --   ALT 25  --   ALKPHOS 55  --   PROT 6.6  --   ALBUMIN 3.9 3.4*    TUMOR MARKERS: No results for input(s): AFPTM, CEA, CA199, CHROMGRNA in the last 8760 hours.  Assessment and Plan:  63 y/o M admitted yesterday due to abnormal labs at [facility] office after patient was seen for n/v/d, abdominal pain, weight gain, poor appetite, fever, fatigue and chills x several weeks. His creatinine was noted to be 6.8 when previously 1.0 - he has no known history of kidney problems. He does have DM which he reports is somewhat controlled. Nephrology currently holding losartan, metformin and omeprazole - lab work for multiple myeloma, serologies and  complements are pending. Request has been made to IR for random renal biopsy for further evaluation.  Afebrile, WBC 13.0 which appears to be near baseline for this patient - he does not have any s/s of infectious  process currently, hgb 13.9, plt 270, creatinine 8.88 today.   Tentatively planned for random renal biopsy tomorrow in IR. Patient to be NPO after midnight, no heparin/lovenox, AM labs ordered.  Risks and benefits of random renal biopsy was discussed with the patient and/or patient's family including, but not limited to bleeding, infection, damage to adjacent structures or low yield requiring additional tests.  All of the questions were answered and there is agreement to proceed.  Consent signed and in chart.   Thank you for this interesting consult.  I greatly enjoyed meeting Shawn Yu and look forward to participating in their care.  A copy of this report was sent to the requesting provider on this date.  Electronically Signed: Joaquim Nam, PA-C 09/29/2018, 2:01 PM   I spent a total of 40 Minutes  in face to face in clinical consultation, greater than 50% of which was counseling/coordinating care for random renal biopsy.

## 2018-09-29 NOTE — Progress Notes (Signed)
PROGRESS NOTE    Shawn Yu  YIR:[id number] DOB: [birth year] DOA: 09/28/2018 PCP: Shawn Low, MD    Brief Narrative:  63 year old male who presented with worsening renal function.  He does have diabetes mellitus, hypertension and dyslipidemia.  Apparently his kidney function was normal 3 months ago, he follow-up with his primary care provider and his kidney function was significantly abnormal with a serum creatinine of 6.  He reported having nausea and vomiting for the last 3 to 4 days prior to hospitalization, associated with a vague abdominal discomfort in the left upper quadrant.  No diarrhea.  No new medication changes or over-the-counter agents.  On his initial physical examination his blood pressure was 128/74, pulse rate 84, respiratory rate 26, oxygen saturation 98%, he had moist mucous membranes, lungs clear to auscultation bilaterally, heart S1-S2  Present and rhythm, abdomen soft, no lower extremity edema.  Sodium 135 potassium 5.7, chloride 95, bicarb 18, glucose 145, BUN 73, creatinine 6.8, lipase 41, white count 14.7, hemoglobin 14.5, hematocrit 35.5, platelets 307.  Urinalysis with greater than 500 glucose, 100 protein, specific gravity 1.016.   Patient was admitted to the hospital with the working diagnosis of acute kidney injury, complicated with hyperkalemia and non gap metabolic acidosis.   Assessment & Plan:   Principal Problem:   ARF (acute renal failure) (HCC) Active Problems:   Essential hypertension   DM2 (diabetes mellitus, type 2) ([place])   1. AKI, oliguric. Patient with rapid worsening of renal function, serum cr today at 8.8 from 6.80, K has improved to 5,4 from 5.7. Serum bicarbonate down to 16. Will continue conservative management with lokelma and sodium bicarbonate. Follow on renal panel in am. No clinical signs of severe uremia or volume overload. Continue to have very Yu urine output down to 100 ml over last 24 H. Further workup with renal US, normal  kidneys, with no obstructive uropathy. UA with no red cells or casts. Follow on complement C3-C4, antistreptolysin, hepatitis panel, ana, electrophoresis, anca.   2. HTN.  Continue blood pressure control with amlodipine, continue holding arb.   3. T2DM. Continue glucose cover and monitoring with insulin sliding scale. Patient tolerating po well.   4. Depression. Continue bupropion.   DVT prophylaxis: scd  Code Status: full Family Communication: no family at the bedside  Disposition Plan/ discharge barriers: pending renal biopsy.   Body mass index is 28.34 kg/m. Malnutrition Type:      Malnutrition Characteristics:      Nutrition Interventions:     RN Pressure Injury Documentation:     Consultants:   Nephrology   Procedures:     Antimicrobials:       Subjective: Patient feeling fatigued, no nausea or vomiting, no chest pain or dyspnea. Denies any rash, or over the counter medication use.   Objective: Vitals:   09/28/18 1329 09/29/18 0020 09/29/18 0530 09/29/18 1202  BP:  127/62 121/70 127/78  Pulse:  87 90 86  Resp:  14 16 18   Temp:  98.8 F (37.1 C) 99 F (37.2 C) 98.1 F (36.7 C)  TempSrc:  Oral Oral Oral  SpO2:  99% 98% 98%  Weight: 90.7 kg 89.6 kg    Height: 5\' 10"  (1.778 m) 5\' 10"  (1.778 m)      Intake/Output Summary (Last 24 hours) at 09/29/2018 1505 Last data filed at 09/29/2018 1243 Gross per 24 hour  Intake 1560 ml  Output 150 ml  Net 1410 ml   Filed Weights   09/28/18 1323  09/28/18 1329 09/29/18 0020  Weight: 90.7 kg 90.7 kg 89.6 kg    Examination:   General: Not in pain or dyspnea, deconditioned  Neurology: Awake and alert, non focal  E ENT: mild pallor, no icterus, oral mucosa moist Cardiovascular: No JVD. S1-S2 present, rhythmic, no gallops, rubs, or murmurs. No lower extremity edema. Pulmonary: positive breath sounds bilaterally, adequate air movement, no wheezing, rhonchi or rales. Gastrointestinal. Abdomen with no  organomegaly, non tender, no rebound or guarding Skin. No rashes Musculoskeletal: no joint deformities     Data Reviewed: I have personally reviewed following labs and imaging studies  CBC: Recent Labs  Lab 09/28/18 1343 09/29/18 0508  WBC 14.7* 13.0*  HGB 14.5 13.9  HCT 45.5 41.8  MCV 92.3 88.7  PLT 307 638   Basic Metabolic Panel: Recent Labs  Lab 09/28/18 1343 09/28/18 1732 09/29/18 0508  NA 135  --  132*  K 5.7*  --  5.4*  CL 95*  --  95*  CO2 18*  --  16*  GLUCOSE 145*  --  242*  BUN 73*  --  87*  CREATININE 6.80*  --  8.88*  CALCIUM 9.2  --  8.4*  MG  --  2.5*  --   PHOS  --   --  9.9*   GFR: Estimated Creatinine Clearance: 9.7 mL/min (A) (by C-G formula based on SCr of 8.88 mg/dL (H)). Liver Function Tests: Recent Labs  Lab 09/28/18 1343 09/29/18 0508  AST 29  --   ALT 25  --   ALKPHOS 55  --   BILITOT 1.0  --   PROT 6.6  --   ALBUMIN 3.9 3.4*   Recent Labs  Lab 09/28/18 1343  LIPASE 41   No results for input(s): AMMONIA in the last 168 hours. Coagulation Profile: No results for input(s): INR, PROTIME in the last 168 hours. Cardiac Enzymes: Recent Labs  Lab 09/28/18 2337  CKTOTAL 110   BNP (last 3 results) No results for input(s): PROBNP in the last 8760 hours. HbA1C: Recent Labs    09/28/18 1742  HGBA1C 8.3*   CBG: Recent Labs  Lab 09/28/18 1844 09/29/18 0200 09/29/18 0614 09/29/18 1154  GLUCAP 85 83 226* 175*   Lipid Profile: No results for input(s): CHOL, HDL, LDLCALC, TRIG, CHOLHDL, LDLDIRECT in the last 72 hours. Thyroid Function Tests: No results for input(s): TSH, T4TOTAL, FREET4, T3FREE, THYROIDAB in the last 72 hours. Anemia Panel: No results for input(s): VITAMINB12, FOLATE, FERRITIN, TIBC, IRON, RETICCTPCT in the last 72 hours.    Radiology Studies: I have reviewed all of the imaging during this hospital visit personally     Scheduled Meds: . amLODipine  10 mg Oral QPM  . buPROPion  300 mg Oral Daily   . insulin aspart  0-9 Units Subcutaneous TID WC  . sodium bicarbonate  1,300 mg Oral BID  . sodium zirconium cyclosilicate  10 g Oral Daily   Continuous Infusions:   LOS: 1 day         Gerome Apley, MD

## 2018-09-29 NOTE — Plan of Care (Signed)
  Problem: Activity: Goal: Risk for activity intolerance will decrease Outcome: Progressing   Problem: Safety: Goal: Ability to remain free from injury will improve Outcome: Progressing   

## 2018-09-29 NOTE — Care Management Note (Signed)
Case Management Note  Patient Details  Name: KEIGO WHALLEY MRN: [id number] Date of Birth: 1956/02/05  Subjective/Objective:  ARF                Action/Plan: Patient lives at home; PCP: Wenda Low, MD; has private insurance with [facility] with prescription drug coverage; CM following for progression of care.  Expected Discharge Date:     Possibly 10/03/2018             Expected Discharge Plan:  Home/Self Care  Discharge planning Services  CM Consult  Status of Service:  In process, will continue to follow  Sherrilyn Rist [phone] 09/29/2018, 2:12 PM

## 2018-09-29 NOTE — Progress Notes (Signed)
[facility] ROUNDING NOTE   Subjective:   History this is a 63 year old gentleman with a longstanding history of type 2 diabetes and hypertension he had normal serum creatinine last year.  2019.  It appears that he has developed acute kidney injury.  There does not appear to be any nephrotoxins of the was on ARB losartan , metformin and omeprazole.  He was asked to come to the emergency room due to abnormal labs.  Blood pressure 120/70 pulse 90 temperature 99 O2 sats 98% room air  Sodium 132 potassium 5.4 chloride 95 CO2 16 glucose 242 BUN 87 creatinine 8.8 calcium 8.4 phosphorus 9.9 albumin 3.4 WBC 13.0 hemoglobin 13.9 and platelets 270 hemoglobin A1c 8.3  Urine output 100 cc 09/29/2018 urinalysis just revealed 6-10 WBC of disease per high-powered film and 100 mg/dL proteinuria.  Foley catheter placed with renal ultrasound showing normal-sized kidneys.  He had a small right pleural effusion associated with atelectasis on chest x-ray  Amlodipine 10 mg daily, Wellbutrin 300 mg daily Ambien 5 mg nightly insulin sliding scale sodium bicarbonate 1.3 g twice daily  Objective:  Vital signs in last 24 hours:  Temp:  [98.8 F (37.1 C)-99 F (37.2 C)] 99 F (37.2 C) (03/11 0530) Pulse Rate:  [76-91] 90 (03/11 0530) Resp:  [11-26] 16 (03/11 0530) BP: (114-156)/(62-92) 121/70 (03/11 0530) SpO2:  [92 %-100 %] 98 % (03/11 0530) Weight:  [89.6 kg-90.7 kg] 89.6 kg (03/11 0020)  Weight change:  Filed Weights   09/28/18 1323 09/28/18 1329 09/29/18 0020  Weight: 90.7 kg 90.7 kg 89.6 kg    Intake/Output: I/O last 3 completed shifts: In: 720 [P.O.:720] Out: 100 [Urine:100]   Intake/Output this shift:  Total I/O In: 480 [P.O.:480] Out: 0  Pleasant gentleman no obvious distress resting comfortably in bed dry mucous membranes CVS-regular rate and rhythm no murmurs rubs gallops RS- CTA no wheezes rales ABD- BS present soft non-distended EXT- no edema   Basic Metabolic  Panel: Recent Labs  Lab 09/28/18 1343 09/28/18 1732 09/29/18 0508  NA 135  --  132*  K 5.7*  --  5.4*  CL 95*  --  95*  CO2 18*  --  16*  GLUCOSE 145*  --  242*  BUN 73*  --  87*  CREATININE 6.80*  --  8.88*  CALCIUM 9.2  --  8.4*  MG  --  2.5*  --   PHOS  --   --  9.9*    Liver Function Tests: Recent Labs  Lab 09/28/18 1343 09/29/18 0508  AST 29  --   ALT 25  --   ALKPHOS 55  --   BILITOT 1.0  --   PROT 6.6  --   ALBUMIN 3.9 3.4*   Recent Labs  Lab 09/28/18 1343  LIPASE 41   No results for input(s): AMMONIA in the last 168 hours.  CBC: Recent Labs  Lab 09/28/18 1343 09/29/18 0508  WBC 14.7* 13.0*  HGB 14.5 13.9  HCT 45.5 41.8  MCV 92.3 88.7  PLT 307 270    Cardiac Enzymes: Recent Labs  Lab 09/28/18 2337  CKTOTAL 110    BNP: Invalid input(s): POCBNP  CBG: Recent Labs  Lab 09/28/18 1844 09/29/18 0614  GLUCAP 85 226*    Microbiology: No results found for this or any previous visit.  Coagulation Studies: No results for input(s): LABPROT, INR in the last 72 hours.  Urinalysis: Recent Labs    09/28/18 [place]  LABSPEC  1.016  PHURINE 5.0  GLUCOSEU >=500*  HGBUR SMALL*  BILIRUBINUR NEGATIVE  KETONESUR NEGATIVE  PROTEINUR 100*  NITRITE NEGATIVE  LEUKOCYTESUR NEGATIVE      Imaging: Dg Chest 2 View  Result Date: 09/28/2018 CLINICAL DATA:  Chest pain EXAM: CHEST - 2 VIEW COMPARISON:  04/28/2016 FINDINGS: Small right pleural effusion with basilar atelectasis. Lungs are otherwise clear. Normal cardiomediastinal contours. IMPRESSION: Small right pleural effusion with associated atelectasis. Electronically Signed   By: Ulyses Jarred M.D.   On: 09/28/2018 18:54   US Renal  Result Date: 09/28/2018 CLINICAL DATA:  Acute renal insufficiency. EXAM: RENAL / URINARY TRACT ULTRASOUND COMPLETE COMPARISON:  None. FINDINGS: Right Kidney: Renal measurements: 12.7 by 5.7 by 6.1 = volume: 231 mL . Echogenicity within normal limits.  No mass or hydronephrosis visualized. Left Kidney: Renal measurements: 12.8 by 6.1 by 6.4 cm = volume: 268 mL. Echogenicity within normal limits. No mass or hydronephrosis visualized. Bladder: Not evaluated, decompressed around a Foley catheter. IMPRESSION: Normal appearance of the kidneys. Electronically Signed   By: Fidela Salisbury M.D.   On: 09/28/2018 21:25     Medications:    . amLODipine  10 mg Oral QPM  . buPROPion  300 mg Oral Daily  . insulin aspart  0-9 Units Subcutaneous TID WC  . sodium bicarbonate  1,300 mg Oral BID   acetaminophen **OR** acetaminophen, hydrALAZINE  Assessment/ Plan:   Acute kidney injury in the setting of diabetes.  He does not have significant proteinuria and renal failure has occurred very rapidly.  He was using a proton pump inhibitor and this has been associated with acute end-stage nephritis urine he has 6-10 WBCs per high-powered film.  CK was 110.  Serologies and complements were pending.  Screening for multiple myeloma with protein electrophoresis and immunofixation are pending.  Evaluation for vasculitis with ANCA and anti-GBM.  Assessment of hepatitis B and hepatitis C and HIV status pending.  I think we need to proceed with renal biopsy to firmly diagnose this condition.  Metabolic acidosis to new sodium bicarbonate 1.3 g twice daily.  Mild hyperkalemia we will continue to follow we will start Lokelma 10 g daily  Diabetes mellitus as per primary team  Pression continues Wellbutrin daily  Hypertension volume appears adequately controlled may have some slight volume depletion with some dry mucous membranes continue to push fluids   LOS: [place] _0 _1 :09 AM

## 2018-09-30 ENCOUNTER — Inpatient Hospital Stay (HOSPITAL_COMMUNITY): Payer: 59

## 2018-09-30 LAB — CBC
HCT: 39.5 % (ref 39.0–52.0)
Hemoglobin: 13.3 g/dL (ref 13.0–17.0)
MCH: 29.7 pg (ref 26.0–34.0)
MCHC: 33.7 g/dL (ref 30.0–36.0)
MCV: 88.2 fL (ref 80.0–100.0)
PLATELETS: 259 10*3/uL (ref 150–400)
RBC: 4.48 MIL/uL (ref 4.22–5.81)
RDW: 13.6 % (ref 11.5–15.5)
WBC: 12 10*3/uL — ABNORMAL HIGH (ref 4.0–10.5)
nRBC: 0 % (ref 0.0–0.2)

## 2018-09-30 LAB — PROTEIN ELECTROPHORESIS, SERUM
A/G RATIO SPE: 1.4 (ref 0.7–1.7)
Albumin ELP: 3.6 g/dL (ref 2.9–4.4)
Alpha-1-Globulin: 0.2 g/dL (ref 0.0–0.4)
Alpha-2-Globulin: 0.8 g/dL (ref 0.4–1.0)
Beta Globulin: 0.7 g/dL (ref 0.7–1.3)
Gamma Globulin: 0.9 g/dL (ref 0.4–1.8)
Globulin, Total: 2.5 g/dL (ref 2.2–3.9)
Total Protein ELP: 6.1 g/dL (ref 6.0–8.5)

## 2018-09-30 LAB — RENAL FUNCTION PANEL
Albumin: 3.1 g/dL — ABNORMAL LOW (ref 3.5–5.0)
Anion gap: 24 — ABNORMAL HIGH (ref 5–15)
BUN: 108 mg/dL — ABNORMAL HIGH (ref 8–23)
CO2: 16 mmol/L — ABNORMAL LOW (ref 22–32)
Calcium: 8.2 mg/dL — ABNORMAL LOW (ref 8.9–10.3)
Chloride: 93 mmol/L — ABNORMAL LOW (ref 98–111)
Creatinine, Ser: 11.72 mg/dL — ABNORMAL HIGH (ref 0.61–1.24)
GFR calc Af Amer: 5 mL/min — ABNORMAL LOW (ref >60)
GFR, EST NON AFRICAN AMERICAN: 4 mL/min — AB (ref >60)
Glucose, Bld: 140 mg/dL — ABNORMAL HIGH (ref 70–99)
Phosphorus: >30 mg/dL — ABNORMAL HIGH (ref 2.5–4.6)
Potassium: 5.1 mmol/L (ref 3.5–5.1)
Sodium: 133 mmol/L — ABNORMAL LOW (ref 135–145)

## 2018-09-30 LAB — GLUCOSE, CAPILLARY
Glucose-Capillary: 126 mg/dL — ABNORMAL HIGH (ref 70–99)
Glucose-Capillary: 145 mg/dL — ABNORMAL HIGH (ref 70–99)
Glucose-Capillary: 119 mg/dL — ABNORMAL HIGH (ref 70–99)
Glucose-Capillary: 127 mg/dL — ABNORMAL HIGH (ref 70–99)

## 2018-09-30 LAB — HCV COMMENT:

## 2018-09-30 LAB — HEPATITIS B SURFACE ANTIBODY,QUALITATIVE: Hep B S Ab: NONREACTIVE

## 2018-09-30 LAB — HEPATITIS B SURFACE ANTIGEN: Hepatitis B Surface Ag: NEGATIVE

## 2018-09-30 LAB — PROTIME-INR
INR: 1 (ref 0.8–1.2)
Prothrombin Time: 13.5 seconds (ref 11.4–15.2)

## 2018-09-30 LAB — ANTISTREPTOLYSIN O TITER: ASO: 39 [IU]/mL (ref 0.0–200.0)

## 2018-09-30 LAB — PARATHYROID HORMONE, INTACT (NO CA): PTH: 52 pg/mL (ref 15–65)

## 2018-09-30 LAB — ANTINUCLEAR ANTIBODIES, IFA: ANA Ab, IFA: NEGATIVE

## 2018-09-30 LAB — C4 COMPLEMENT: Complement C4, Body Fluid: 27 mg/dL (ref 14–44)

## 2018-09-30 LAB — HEPATITIS C ANTIBODY (REFLEX): HCV Ab: 0.1 s/co ratio (ref 0.0–0.9)

## 2018-09-30 LAB — C3 COMPLEMENT: C3 Complement: 81 mg/dL — ABNORMAL LOW (ref 82–167)

## 2018-09-30 MED ORDER — LIDOCAINE HCL (PF) 1 % IJ SOLN
INTRAMUSCULAR | Status: AC
  Filled 2018-09-30: qty 30

## 2018-09-30 MED ORDER — FENTANYL CITRATE (PF) 100 MCG/2ML IJ SOLN
INTRAMUSCULAR | Status: AC | PRN
  Administered 2018-09-30: 50 ug via INTRAVENOUS

## 2018-09-30 MED ORDER — GELATIN ABSORBABLE 12-7 MM EX MISC
CUTANEOUS | Status: AC
  Filled 2018-09-30: qty 1

## 2018-09-30 MED ORDER — FENTANYL CITRATE (PF) 100 MCG/2ML IJ SOLN
INTRAMUSCULAR | Status: AC
  Filled 2018-09-30: qty 2

## 2018-09-30 MED ORDER — MIDAZOLAM HCL 2 MG/2ML IJ SOLN
INTRAMUSCULAR | Status: AC | PRN
  Administered 2018-09-30: 1 mg via INTRAVENOUS

## 2018-09-30 MED ORDER — MIDAZOLAM HCL 2 MG/2ML IJ SOLN
INTRAMUSCULAR | Status: AC
  Filled 2018-09-30: qty 2

## 2018-09-30 MED ORDER — INSULIN ASPART 100 UNIT/ML ~~LOC~~ SOLN
0.0000 [IU] | Freq: Three times a day (TID) | SUBCUTANEOUS | Status: DC
  Administered 2018-09-30 – 2018-10-01 (×3): 1 [IU] via SUBCUTANEOUS
  Administered 2018-10-01: 3 [IU] via SUBCUTANEOUS
  Administered 2018-10-02: 1 [IU] via SUBCUTANEOUS
  Administered 2018-10-02 (×2): 2 [IU] via SUBCUTANEOUS
  Administered 2018-10-03 (×2): 1 [IU] via SUBCUTANEOUS
  Administered 2018-10-03: 3 [IU] via SUBCUTANEOUS
  Administered 2018-10-04: 2 [IU] via SUBCUTANEOUS
  Administered 2018-10-04: 3 [IU] via SUBCUTANEOUS
  Administered 2018-10-05: 1 [IU] via SUBCUTANEOUS
  Administered 2018-10-05 (×2): 2 [IU] via SUBCUTANEOUS
  Administered 2018-10-06: 3 [IU] via SUBCUTANEOUS
  Administered 2018-10-06: 2 [IU] via SUBCUTANEOUS
  Administered 2018-10-07 (×2): 1 [IU] via SUBCUTANEOUS

## 2018-09-30 NOTE — Procedures (Signed)
Interventional Radiology Procedure Note  Procedure: US guided medical renal biopsy, left  Complications: None Recommendations:  - Ok to shower tomorrow - Do not submerge - Routine care   Signed,  Dulcy Fanny. Earleen Newport, DO

## 2018-09-30 NOTE — Progress Notes (Signed)
[facility] ROUNDING NOTE   Subjective:   History this is a 63 year old gentleman with a longstanding history of type 2 diabetes and hypertension he had normal serum creatinine last year.  2019.  It appears that he has developed acute kidney injury.  There does not appear to be any nephrotoxins of the was on ARB losartan , metformin and omeprazole.  He was asked to come to the emergency room due to abnormal labs.  He is scheduled for renal biopsy 09/30/2018   Blood pressure 134/60 pulse 87 temperature 97.9  Sodium 133 potassium 5.1 chloride 93 CO2 16 BUN 108 creatinine 11.72 glucose 140 phosphorus greater than 30 albumin 3.1 calcium 8.2 WBC 12 hemoglobin 13.3 platelets 259  Urine output 100 cc 09/29/2018 urinalysis just revealed 6-10 WBC of disease per high-powered film and 100 mg/dL proteinuria.  Foley catheter placed with renal ultrasound showing normal-sized kidneys.  He had a small right pleural effusion associated with atelectasis on chest x-ray  Amlodipine 10 mg daily, Wellbutrin 300 mg daily Ambien 5 mg nightly insulin sliding scale sodium bicarbonate 1.3 g twice daily  Objective:  Vital signs in last 24 hours:  Temp:  [97.9 F (36.6 C)-99.9 F (37.7 C)] 97.9 F (36.6 C) (03/12 0843) Pulse Rate:  [84-92] 87 (03/12 0843) Resp:  [18-22] 18 (03/12 0843) BP: (127-141)/(60-79) 134/60 (03/12 0843) SpO2:  [97 %-100 %] 99 % (03/12 0843)  Weight change:  Filed Weights   09/28/18 1323 09/28/18 1329 09/29/18 0020  Weight: 90.7 kg 90.7 kg 89.6 kg    Intake/Output: I/O last 3 completed shifts: In: 1800 [P.O.:1800] Out: 500 [Urine:500]   Intake/Output this shift:  Total I/O In: 240 [P.O.:240] Out: 50 [Urine:50] Pleasant gentleman no obvious distress resting comfortably in bed dry mucous membranes CVS-regular rate and rhythm no murmurs rubs gallops RS- CTA no wheezes rales ABD- BS present soft non-distended EXT- no edema   Basic Metabolic Panel: Recent Labs   Lab 09/28/18 1343 09/28/18 1732 09/29/18 0508 09/30/18 0535  NA 135  --  132* 133*  K 5.7*  --  5.4* 5.1  CL 95*  --  95* 93*  CO2 18*  --  16* 16*  GLUCOSE 145*  --  242* 140*  BUN 73*  --  87* 108*  CREATININE 6.80*  --  8.88* 11.72*  CALCIUM 9.2  --  8.4* 8.2*  MG  --  2.5*  --   --   PHOS  --   --  9.9* >30.0*    Liver Function Tests: Recent Labs  Lab 09/28/18 1343 09/29/18 0508 09/30/18 0535  AST 29  --   --   ALT 25  --   --   ALKPHOS 55  --   --   BILITOT 1.0  --   --   PROT 6.6  --   --   ALBUMIN 3.9 3.4* 3.1*   Recent Labs  Lab 09/28/18 1343  LIPASE 41   No results for input(s): AMMONIA in the last 168 hours.  CBC: Recent Labs  Lab 09/28/18 1343 09/29/18 0508 09/30/18 0535  WBC 14.7* 13.0* 12.0*  HGB 14.5 13.9 13.3  HCT 45.5 41.8 39.5  MCV 92.3 88.7 88.2  PLT 307 270 259    Cardiac Enzymes: Recent Labs  Lab 09/28/18 2337  CKTOTAL 110    BNP: Invalid input(s): POCBNP  CBG: Recent Labs  Lab 09/29/18 1154 09/29/18 1628 09/29/18 2151 09/30/18 0636 09/30/18 1139  GLUCAP 175* 186* 108* 126* 145*  Microbiology: No results found for this or any previous visit.  Coagulation Studies: Recent Labs    09/30/18 0535  LABPROT 13.5  INR 1.0    Urinalysis: Recent Labs    09/28/18 1742  COLORURINE YELLOW  LABSPEC 1.016  PHURINE 5.0  GLUCOSEU >=500*  HGBUR SMALL*  BILIRUBINUR NEGATIVE  KETONESUR NEGATIVE  PROTEINUR 100*  NITRITE NEGATIVE  LEUKOCYTESUR NEGATIVE      Imaging: Dg Chest 2 View  Result Date: 09/28/2018 CLINICAL DATA:  Chest pain EXAM: CHEST - 2 VIEW COMPARISON:  04/28/2016 FINDINGS: Small right pleural effusion with basilar atelectasis. Lungs are otherwise clear. Normal cardiomediastinal contours. IMPRESSION: Small right pleural effusion with associated atelectasis. Electronically Signed   By: Ulyses Jarred M.D.   On: 09/28/2018 18:54   US Renal  Result Date: 09/28/2018 CLINICAL DATA:  Acute renal  insufficiency. EXAM: RENAL / URINARY TRACT ULTRASOUND COMPLETE COMPARISON:  None. FINDINGS: Right Kidney: Renal measurements: 12.7 by 5.7 by 6.1 = volume: 231 mL . Echogenicity within normal limits. No mass or hydronephrosis visualized. Left Kidney: Renal measurements: 12.8 by 6.1 by 6.4 cm = volume: 268 mL. Echogenicity within normal limits. No mass or hydronephrosis visualized. Bladder: Not evaluated, decompressed around a Foley catheter. IMPRESSION: Normal appearance of the kidneys. Electronically Signed   By: Fidela Salisbury M.D.   On: 09/28/2018 21:25     Medications:    . amLODipine  10 mg Oral QPM  . buPROPion  300 mg Oral Daily  . insulin aspart  0-9 Units Subcutaneous TID WC  . sodium bicarbonate  1,300 mg Oral BID  . sodium zirconium cyclosilicate  10 g Oral Daily   acetaminophen **OR** acetaminophen, hydrALAZINE, zolpidem  Assessment/ Plan:   Acute kidney injury in the setting of diabetes.  He does not have significant proteinuria and renal failure has occurred very rapidly.  He was using a proton pump inhibitor and this has been associated with acute end-stage nephritis urine he has 6-10 WBCs per high-powered film.  CK was 110.  Serologies and complements were pending.  Screening for multiple myeloma with protein electrophoresis and immunofixation are pending.  Evaluation for vasculitis with ANCA and anti-GBM.  Hepatitis B and C- negative HIV status pending, ASO was negative complements appear to be within normal range although C3 was a little bit lower than normal.  We are proceeding with renal biopsy.  Renal function continues to decline anticipate that dialysis will also be needed shortly  Metabolic acidosis to new sodium bicarbonate 1.3 g twice daily.  Mild hyperkalemia improved  Diabetes mellitus as per primary team  Depression continues Wellbutrin daily  Hypertension volume appears adequately controlled may have some slight volume depletion with some dry mucous  membranes continue to push fluids.  Continues on amlodipine 10 mg daily.  Secondary parathyroidism PTH 52   LOS: 2 Sherril Croon @TODAY @11 :54 AM

## 2018-09-30 NOTE — Progress Notes (Signed)
PROGRESS NOTE    DESHAWN WITTY  GNF:[id number] DOB: June 02, 1956 DOA: 09/28/2018 PCP: Wenda Low, MD    Brief Narrative:  63 year old male who presented with worsening renal function.  He does have diabetes mellitus, hypertension and dyslipidemia.  Apparently his kidney function was normal 3 months ago, he follow-up with his primary care provider and his kidney function was significantly abnormal with a serum creatinine of 6.  He reported having nausea and vomiting for the last 3 to 4 days prior to hospitalization, associated with a vague abdominal discomfort in the left upper quadrant.  No diarrhea.  No new medication changes or over-the-counter agents.  On his initial physical examination his blood pressure was 128/74, pulse rate 84, respiratory rate 26, oxygen saturation 98%, he had moist mucous membranes, lungs clear to auscultation bilaterally, heart S1-S2  Present and rhythm, abdomen soft, no lower extremity edema.  Sodium 135 potassium 5.7, chloride 95, bicarb 18, glucose 145, BUN 73, creatinine 6.8, lipase 41, white count 14.7, hemoglobin 14.5, hematocrit 35.5, platelets 307.  Urinalysis with greater than 500 glucose, 100 protein, specific gravity 1.016.   Patient was admitted to the hospital with the working diagnosis of acute kidney injury, complicated with hyperkalemia and non gap metabolic acidosis.    Assessment & Plan:   Principal Problem:   ARF (acute renal failure) (HCC) Active Problems:   Essential hypertension   DM2 (diabetes mellitus, type 2) ([place])  1. AKI, oliguric. Urine output over last 24 H,400 cc, worsening serum cr now up to 11. Complement c3 and c4 essentially within normal range. ANA negative and hepatitis serologies have been negative. Patient with no signs of uremia or volume overload. His K is at 5,1 and serum bicarbonate at 16. Continue lokelma and oral sodium bicarbonate. Ca at 8,2, (high P, possible lab error)  2. HTN. Continue amlodipine, for blood  pressure control.  3. T2DM. Glucose cover and monitoring with insulin sliding scale. Patient has been npo past midnight. Fasting glucose this am 108 mg/dl.  4. Depression. On bupropion.   DVT prophylaxis: scd  Code Status: full Family Communication: no family at the bedside  Disposition Plan/ discharge barriers: pending renal biopsy.   Body mass index is 28.34 kg/m. Malnutrition Type:      Malnutrition Characteristics:      Nutrition Interventions:     RN Pressure Injury Documentation:     Consultants:   Nephrology   Procedures:     Antimicrobials:       Subjective: Patient denies any chest pain or dyspnea, no nausea or vomiting, has been npo since midnight for renal biopsy.   Objective: Vitals:   09/29/18 1640 09/29/18 1942 09/29/18 2320 09/30/18 0843  BP: 130/78 136/79 (!) 141/75 134/60  Pulse: 84 86 92 87  Resp: 18 (!) 22 20 18   Temp: 99.9 F (37.7 C) 98.5 F (36.9 C) 98.4 F (36.9 C) 97.9 F (36.6 C)  TempSrc: Oral Oral Oral Oral  SpO2: 97% 100% 99% 99%  Weight:      Height:        Intake/Output Summary (Last 24 hours) at 09/30/2018 1353 Last data filed at 09/30/2018 0846 Gross per 24 hour  Intake 480 ml  Output 400 ml  Net 80 ml   Filed Weights   09/28/18 1323 09/28/18 1329 09/29/18 0020  Weight: 90.7 kg 90.7 kg 89.6 kg    Examination:   General: deconditioned  Neurology: Awake and alert, non focal  E ENT: no pallor, no icterus, oral  mucosa moist Cardiovascular: No JVD. S1-S2 present, rhythmic, no gallops, rubs, or murmurs. No lower extremity edema. Pulmonary: vesicular breath sounds bilaterally, adequate air movement, no wheezing, rhonchi or rales. Gastrointestinal. Abdomen with no organomegaly, non tender, no rebound or guarding Skin. No rashes Musculoskeletal: no joint deformities     Data Reviewed: I have personally reviewed following labs and imaging studies  CBC: Recent Labs  Lab 09/28/18 1343 09/29/18 0508  09/30/18 0535  WBC 14.7* 13.0* 12.0*  HGB 14.5 13.9 13.3  HCT 45.5 41.8 39.5  MCV 92.3 88.7 88.2  PLT 307 270 294   Basic Metabolic Panel: Recent Labs  Lab 09/28/18 1343 09/28/18 1732 09/29/18 0508 09/30/18 0535  NA 135  --  132* 133*  K 5.7*  --  5.4* 5.1  CL 95*  --  95* 93*  CO2 18*  --  16* 16*  GLUCOSE 145*  --  242* 140*  BUN 73*  --  87* 108*  CREATININE 6.80*  --  8.88* 11.72*  CALCIUM 9.2  --  8.4* 8.2*  MG  --  2.5*  --   --   PHOS  --   --  9.9* >30.0*   GFR: Estimated Creatinine Clearance: 7.4 mL/min (A) (by C-G formula based on SCr of 11.72 mg/dL (H)). Liver Function Tests: Recent Labs  Lab 09/28/18 1343 09/29/18 0508 09/30/18 0535  AST 29  --   --   ALT 25  --   --   ALKPHOS 55  --   --   BILITOT 1.0  --   --   PROT 6.6  --   --   ALBUMIN 3.9 3.4* 3.1*   Recent Labs  Lab 09/28/18 1343  LIPASE 41   No results for input(s): AMMONIA in the last 168 hours. Coagulation Profile: Recent Labs  Lab 09/30/18 0535  INR 1.0   Cardiac Enzymes: Recent Labs  Lab 09/28/18 2337  CKTOTAL 110   BNP (last 3 results) No results for input(s): PROBNP in the last 8760 hours. HbA1C: Recent Labs    09/28/18 1742  HGBA1C 8.3*   CBG: Recent Labs  Lab 09/29/18 1154 09/29/18 1628 09/29/18 2151 09/30/18 0636 09/30/18 1139  GLUCAP 175* 186* 108* 126* 145*   Lipid Profile: No results for input(s): CHOL, HDL, LDLCALC, TRIG, CHOLHDL, LDLDIRECT in the last 72 hours. Thyroid Function Tests: No results for input(s): TSH, T4TOTAL, FREET4, T3FREE, THYROIDAB in the last 72 hours. Anemia Panel: No results for input(s): VITAMINB12, FOLATE, FERRITIN, TIBC, IRON, RETICCTPCT in the last 72 hours.    Radiology Studies: I have reviewed all of the imaging during this hospital visit personally     Scheduled Meds: . amLODipine  10 mg Oral QPM  . buPROPion  300 mg Oral Daily  . insulin aspart  0-9 Units Subcutaneous TID WC  . sodium bicarbonate  1,300 mg Oral  BID  . sodium zirconium cyclosilicate  10 g Oral Daily   Continuous Infusions:   LOS: 2 days        Mauricio Gerome Apley, MD

## 2018-09-30 NOTE — Progress Notes (Signed)
PROGRESS NOTE  Shawn Yu CWU:[id number] DOB: 01/30/1956 DOA: 09/28/2018 PCP: Wenda Low, MD  HPI/Brief Narrative  Shawn Yu is a 63 y.o. year old male with medical history significant for diabetes mellitus type 2, hypertension and dyslipidemia. He was sent to the ED on 09/28/2018 by his PCP due to routine labwork showing elevated Cr (Cr previously normal 3 months ago, per patient). In the ED, Cr was 6.8. Potassium was 5.7, and EKG showed changes consistent with hyperkalemia. UA Showed 6-10 WBCs, no RBCs, no casts; protein of 100. Renal sonogram was performed and showed 251mL urine; foley was placed. He was found to have acute oliguric renal failure, complicated by hyperkalemia and non gap metabolic acidosis, and was admitted for further management..  This patient is also being closely followed by nephrology.  Subjective Before his admission, patient endorsed a few days of nausea, as well as a few months of slow weight gain. Today, patient denies nausea, denies vomiting, denies black stool. He does continue to have intermittent hiccups, and hasn't identified anything in particular that cause these. Denies any joint/muscle pain. He is feeling a bit anxious about today's kidney biopsy, but is feeling okay.   Assessment/Plan: Acute oliguric renal failure Cr 11.72 today, from 8.88 yesterday, and 6.8 in the ED. Cr was previously normal 3 months ago, per patient. Potassium is now in normal range at 5.4. This pt rarely takes NSAIDs, but does regularly take Losartan, Metformin, and Omeprazole. These medications are currently being held. No obstruction seen on renal US; Foley catheter in place to monitor I/Os. Serologies all negative except for C3 complement, which is slightly decreased at 81. Other labs are negative for AKI etiology- negative for: hep B, hep C, antistreptolysin O, C4 complement. Still awaiting ANA, HIV. CK normal. Continue to monitor I/Os, weight, potassium level. Continue  Lokelma, sodium bicarb. Patient with longstanding diabetes mellitus, A1c 8.3. This patient is also followed by nephrology. Kidney biopsy is planned for today.   Hyperkalemia Initially elevated at 5.7; now in normal range at 5.4. Continue Lokelma.   Hypertension Stable. Continue BP control with Amlodipine. Continue holding ARB- Losartan.  Type 2 Diabetes Mellitus A1c 8.3 on 3/10. Continue insulin sliding scale.  Depression  Stable, continue Bupropion.   DVT prophylaxis: scd Code Status: full Family Communication: no family at the bedside  Consults: nephrology Disposition Plan/ discharge barriers: pending renal biopsy.   Objective: Vitals:   09/29/18 1640 09/29/18 1942 09/29/18 2320 09/30/18 0843  BP: 130/78 136/79 (!) 141/75 134/60  Pulse: 84 86 92 87  Resp: 18 (!) 22 20 18   Temp: 99.9 F (37.7 C) 98.5 F (36.9 C) 98.4 F (36.9 C) 97.9 F (36.6 C)  TempSrc: Oral Oral Oral Oral  SpO2: 97% 100% 99% 99%  Weight:      Height:        Intake/Output Summary (Last 24 hours) at 09/30/2018 1034 Last data filed at 09/30/2018 0846 Gross per 24 hour  Intake 840 ml  Output 450 ml  Net 390 ml   Filed Weights   09/28/18 1323 09/28/18 1329 09/29/18 0020  Weight: 90.7 kg 90.7 kg 89.6 kg    Exam:  Constitutional:normal appearing male, in no acute distress Eyes: EOMI, anicteric, normal conjunctivae ENMT: Oropharynx with moist mucous membranes Neck: FROM Cardiovascular: RRR no MRGs, with no peripheral edema Respiratory: Normal respiratory effort, clear breath sounds, no wheezing or crackles Abdomen: Soft,non-tender, with no HSM Skin: No rash ulcers, or lesions. Without skin tenting  Neurologic:  Grossly no focal neuro deficit. Psychiatric:Appropriate affect, and mood.   Data Reviewed: CBC: Recent Labs  Lab 09/28/18 1343 09/29/18 0508 09/30/18 0535  WBC 14.7* 13.0* 12.0*  HGB 14.5 13.9 13.3  HCT 45.5 41.8 39.5  MCV 92.3 88.7 88.2  PLT 307 270 301   Basic Metabolic  Panel: Recent Labs  Lab 09/28/18 1343 09/28/18 1732 09/29/18 0508 09/30/18 0535  NA 135  --  132* 133*  K 5.7*  --  5.4* 5.1  CL 95*  --  95* 93*  CO2 18*  --  16* 16*  GLUCOSE 145*  --  242* 140*  BUN 73*  --  87* 108*  CREATININE 6.80*  --  8.88* 11.72*  CALCIUM 9.2  --  8.4* 8.2*  MG  --  2.5*  --   --   PHOS  --   --  9.9* >30.0*   GFR: Estimated Creatinine Clearance: 7.4 mL/min (A) (by C-G formula based on SCr of 11.72 mg/dL (H)). Liver Function Tests: Recent Labs  Lab 09/28/18 1343 09/29/18 0508 09/30/18 0535  AST 29  --   --   ALT 25  --   --   ALKPHOS 55  --   --   BILITOT 1.0  --   --   PROT 6.6  --   --   ALBUMIN 3.9 3.4* 3.1*   Recent Labs  Lab 09/28/18 1343  LIPASE 41   No results for input(s): AMMONIA in the last 168 hours. Coagulation Profile: Recent Labs  Lab 09/30/18 0535  INR 1.0   Cardiac Enzymes: Recent Labs  Lab 09/28/18 2337  CKTOTAL 110   BNP (last 3 results) No results for input(s): PROBNP in the last 8760 hours. HbA1C: Recent Labs    09/28/18 1742  HGBA1C 8.3*   CBG: Recent Labs  Lab 09/29/18 0614 09/29/18 1154 09/29/18 1628 09/29/18 2151 09/30/18 0636  GLUCAP 226* 175* 186* 108* 126*   Lipid Profile: No results for input(s): CHOL, HDL, LDLCALC, TRIG, CHOLHDL, LDLDIRECT in the last 72 hours. Thyroid Function Tests: No results for input(s): TSH, T4TOTAL, FREET4, T3FREE, THYROIDAB in the last 72 hours. Anemia Panel: No results for input(s): VITAMINB12, FOLATE, FERRITIN, TIBC, IRON, RETICCTPCT in the last 72 hours. Urine analysis:    Component Value Date/Time   COLORURINE YELLOW 09/28/2018 1742   APPEARANCEUR HAZY (A) 09/28/2018 1742   LABSPEC 1.016 09/28/2018 1742   PHURINE 5.0 09/28/2018 1742   GLUCOSEU >=500 (A) 09/28/2018 1742   HGBUR SMALL (A) 09/28/2018 1742   BILIRUBINUR NEGATIVE 09/28/2018 1742   KETONESUR NEGATIVE 09/28/2018 1742   PROTEINUR 100 (A) 09/28/2018 1742   NITRITE NEGATIVE 09/28/2018 1742    LEUKOCYTESUR NEGATIVE 09/28/2018 1742   Sepsis Labs: @LABRCNTIP (procalcitonin:4,lacticidven:4)  )No results found for this or any previous visit (from the past 240 hour(s)).    Studies: No results found.  Scheduled Meds: . amLODipine  10 mg Oral QPM  . buPROPion  300 mg Oral Daily  . insulin aspart  0-9 Units Subcutaneous TID WC  . sodium bicarbonate  1,300 mg Oral BID  . sodium zirconium cyclosilicate  10 g Oral Daily    Continuous Infusions:   LOS: 2 days    Marin Roberts, PA-S 09/30/18

## 2018-09-30 NOTE — Plan of Care (Signed)
  Problem: Education: Goal: Knowledge of General Education information will improve Description Including pain rating scale, medication(s)/side effects and non-pharmacologic comfort measures Outcome: Progressing   Problem: Health Behavior/Discharge Planning: Goal: Ability to manage health-related needs will improve Outcome: Progressing   Problem: Clinical Measurements: Goal: Ability to maintain clinical measurements within normal limits will improve Outcome: Progressing   Problem: Nutrition: Goal: Adequate nutrition will be maintained Outcome: Progressing   Problem: Elimination: Goal: Will not experience complications related to bowel motility Outcome: Progressing   Problem: Education: Goal: Knowledge of disease and its progression will improve Outcome: Progressing

## 2018-10-01 LAB — RENAL FUNCTION PANEL
Albumin: 3.4 g/dL — ABNORMAL LOW (ref 3.5–5.0)
Anion gap: 24 — ABNORMAL HIGH (ref 5–15)
BUN: 127 mg/dL — ABNORMAL HIGH (ref 8–23)
CO2: 16 mmol/L — AB (ref 22–32)
Calcium: 8.1 mg/dL — ABNORMAL LOW (ref 8.9–10.3)
Chloride: 93 mmol/L — ABNORMAL LOW (ref 98–111)
Creatinine, Ser: 13.82 mg/dL — ABNORMAL HIGH (ref 0.61–1.24)
GFR calc Af Amer: 4 mL/min — ABNORMAL LOW (ref >60)
GFR calc non Af Amer: 3 mL/min — ABNORMAL LOW (ref >60)
Glucose, Bld: 133 mg/dL — ABNORMAL HIGH (ref 70–99)
Phosphorus: >30 mg/dL — ABNORMAL HIGH (ref 2.5–4.6)
Potassium: 5.1 mmol/L (ref 3.5–5.1)
Sodium: 133 mmol/L — ABNORMAL LOW (ref 135–145)

## 2018-10-01 LAB — GLUCOSE, CAPILLARY
Glucose-Capillary: 128 mg/dL — ABNORMAL HIGH (ref 70–99)
Glucose-Capillary: 210 mg/dL — ABNORMAL HIGH (ref 70–99)
Glucose-Capillary: 119 mg/dL — ABNORMAL HIGH (ref 70–99)
Glucose-Capillary: 157 mg/dL — ABNORMAL HIGH (ref 70–99)

## 2018-10-01 LAB — MPO/PR-3 (ANCA) ANTIBODIES
ANCA Proteinase 3: <3.5 U/mL (ref 0.0–3.5)
Myeloperoxidase Abs: <9 U/mL (ref 0.0–9.0)

## 2018-10-01 MED ORDER — DIPHENHYDRAMINE HCL 25 MG PO CAPS
25.0000 mg | ORAL_CAPSULE | Freq: Every day | ORAL | Status: DC
  Administered 2018-10-01: 25 mg via ORAL
  Filled 2018-10-01: qty 1

## 2018-10-01 MED ORDER — DIPHENOXYLATE-ATROPINE 2.5-0.025 MG PO TABS
1.0000 | ORAL_TABLET | Freq: Four times a day (QID) | ORAL | Status: DC | PRN
  Administered 2018-10-01: 1 via ORAL
  Filled 2018-10-01: qty 1

## 2018-10-01 NOTE — Progress Notes (Addendum)
PROGRESS NOTE    DEEN DEGUIA  SJG:[id number] DOB: Oct 19, 1955 DOA: 09/28/2018 PCP: Wenda Low, MD    Brief Narrative:  63 year old male who presented with worsening renal function.  He does have diabetes mellitus, hypertension and dyslipidemia.  Apparently his kidney function was normal 3 months ago, he follow-up with his primary care provider and his kidney function was significantly abnormal with a serum creatinine of 6.  He reported having nausea and vomiting for the last 3 to 4 days prior to hospitalization, associated with a vague abdominal discomfort in the left upper quadrant.  No diarrhea.  No new medication changes or over-the-counter agents.  On his initial physical examination his blood pressure was 128/74, pulse rate 84, respiratory rate 26, oxygen saturation 98%, he had moist mucous membranes, lungs clear to auscultation bilaterally, heart S1-S2  Present and rhythm, abdomen soft, no lower extremity edema.  Sodium 135 potassium 5.7, chloride 95, bicarb 18, glucose 145, BUN 73, creatinine 6.8, lipase 41, white count 14.7, hemoglobin 14.5, hematocrit 35.5, platelets 307.  Urinalysis with greater than 500 glucose, 100 protein, specific gravity 1.016.   Patient was admitted to the hospital with the working diagnosis of acute kidney injury, complicated with hyperkalemia and non gap metabolic acidosis.     Assessment & Plan:   Principal Problem:   ARF (acute renal failure) (HCC) Active Problems:   Essential hypertension   DM2 (diabetes mellitus, type 2) ([place])  1. AKI, possible ATN. positive anion gap metabolic acidosis. Serum creatine continue to rise, today up to 13.8 with K at 5,1 and serum bicarbonate at 16. Noted persistent P elevation, but ca is 8.1. Normal complement. Bland sediment in urine analysis. Follow renal biopsy results. Note improvement in urine output up to 1,375 ml, that may indicate a polyuric phase of ATN. Today with no indications for renal replacement  therapy. Will continue to follow renal panel in am. Continue lokelma and sodium bicarbonate.  2. HTN.  Blood pressure control with amlodipine, systolic blood pressure has been 120 to 130 mmHg.   3. T2DM. Fasting glucose this am is 133, will continue glucose cover and monitoring with insulin sliding scale.   4. Depression. On bupropion.   5. New diarrhea. Possible multifactorial, will add as needed loperamide.   DVT prophylaxis: scd  Code Status: full Family Communication: no family at the bedside  Disposition Plan/ discharge barriers: pending renal biopsy   Body mass index is 28.55 kg/m. Malnutrition Type:      Malnutrition Characteristics:      Nutrition Interventions:     RN Pressure Injury Documentation:     Consultants:   Nephrology   Procedures:     Antimicrobials:       Subjective: Patient reports loss bowel movement, no abdominal pain, no nausea or vomiting. No chest pain or dyspnea.   Objective: Vitals:   10/01/18 0414 10/01/18 0500 10/01/18 0849 10/01/18 1250  BP: 136/82  135/84 124/78  Pulse: 90  88 86  Resp: 18   20  Temp: 98.9 F (37.2 C)   98.7 F (37.1 C)  TempSrc: Oral   Oral  SpO2: 96%   98%  Weight:  90.3 kg    Height:        Intake/Output Summary (Last 24 hours) at 10/01/2018 1446 Last data filed at 10/01/2018 0900 Gross per 24 hour  Intake 1320 ml  Output 1125 ml  Net 195 ml   Filed Weights   09/28/18 1329 09/29/18 0020 10/01/18 0500  Weight:  90.7 kg 89.6 kg 90.3 kg    Examination:   General: Not in pain or dyspnea  Neurology: Awake and alert, non focal  E ENT: no pallor, no icterus, oral mucosa moist Cardiovascular: No JVD. S1-S2 present, rhythmic, no gallops, rubs, or murmurs. Trace lower extremity edema. Pulmonary: positive breath sounds bilaterally, adequate air movement, no wheezing, rhonchi or rales. Gastrointestinal. Abdomen with, no organomegaly, non tender, no rebound or guarding Skin. No rashes  Musculoskeletal: no joint deformities     Data Reviewed: I have personally reviewed following labs and imaging studies  CBC: Recent Labs  Lab 09/28/18 1343 09/29/18 0508 09/30/18 0535  WBC 14.7* 13.0* 12.0*  HGB 14.5 13.9 13.3  HCT 45.5 41.8 39.5  MCV 92.3 88.7 88.2  PLT 307 270 540   Basic Metabolic Panel: Recent Labs  Lab 09/28/18 1343 09/28/18 1732 09/29/18 0508 09/30/18 0535 10/01/18 0431  NA 135  --  132* 133* 133*  K 5.7*  --  5.4* 5.1 5.1  CL 95*  --  95* 93* 93*  CO2 18*  --  16* 16* 16*  GLUCOSE 145*  --  242* 140* 133*  BUN 73*  --  87* 108* 127*  CREATININE 6.80*  --  8.88* 11.72* 13.82*  CALCIUM 9.2  --  8.4* 8.2* 8.1*  MG  --  2.5*  --   --   --   PHOS  --   --  9.9* >30.0* >30.0*   GFR: Estimated Creatinine Clearance: 6.3 mL/min (A) (by C-G formula based on SCr of 13.82 mg/dL (H)). Liver Function Tests: Recent Labs  Lab 09/28/18 1343 09/29/18 0508 09/30/18 0535 10/01/18 0431  AST 29  --   --   --   ALT 25  --   --   --   ALKPHOS 55  --   --   --   BILITOT 1.0  --   --   --   PROT 6.6  --   --   --   ALBUMIN 3.9 3.4* 3.1* 3.4*   Recent Labs  Lab 09/28/18 1343  LIPASE 41   No results for input(s): AMMONIA in the last 168 hours. Coagulation Profile: Recent Labs  Lab 09/30/18 0535  INR 1.0   Cardiac Enzymes: Recent Labs  Lab 09/28/18 2337  CKTOTAL 110   BNP (last 3 results) No results for input(s): PROBNP in the last 8760 hours. HbA1C: Recent Labs    09/28/18 1742  HGBA1C 8.3*   CBG: Recent Labs  Lab 09/30/18 1139 09/30/18 1729 09/30/18 2100 10/01/18 0613 10/01/18 1139  GLUCAP 145* 119* 127* 128* 210*   Lipid Profile: No results for input(s): CHOL, HDL, LDLCALC, TRIG, CHOLHDL, LDLDIRECT in the last 72 hours. Thyroid Function Tests: No results for input(s): TSH, T4TOTAL, FREET4, T3FREE, THYROIDAB in the last 72 hours. Anemia Panel: No results for input(s): VITAMINB12, FOLATE, FERRITIN, TIBC, IRON, RETICCTPCT in  the last 72 hours.    Radiology Studies: I have reviewed all of the imaging during this hospital visit personally     Scheduled Meds: . amLODipine  10 mg Oral QPM  . buPROPion  300 mg Oral Daily  . diphenhydrAMINE  25 mg Oral QHS  . insulin aspart  0-9 Units Subcutaneous TID WC  . sodium bicarbonate  1,300 mg Oral BID  . sodium zirconium cyclosilicate  10 g Oral Daily   Continuous Infusions:   LOS: 3 days        Mauricio Gerome Apley, MD

## 2018-10-02 LAB — RENAL FUNCTION PANEL
Albumin: 3.4 g/dL — ABNORMAL LOW (ref 3.5–5.0)
Anion gap: 24 — ABNORMAL HIGH (ref 5–15)
BUN: 142 mg/dL — ABNORMAL HIGH (ref 8–23)
CO2: 17 mmol/L — ABNORMAL LOW (ref 22–32)
Calcium: 8 mg/dL — ABNORMAL LOW (ref 8.9–10.3)
Chloride: 90 mmol/L — ABNORMAL LOW (ref 98–111)
Creatinine, Ser: 14.87 mg/dL — ABNORMAL HIGH (ref 0.61–1.24)
GFR calc Af Amer: 4 mL/min — ABNORMAL LOW (ref >60)
GFR, EST NON AFRICAN AMERICAN: 3 mL/min — AB (ref >60)
Glucose, Bld: 157 mg/dL — ABNORMAL HIGH (ref 70–99)
Phosphorus: >30.1 mg/dL — ABNORMAL HIGH (ref 2.5–4.6)
Potassium: 4.3 mmol/L (ref 3.5–5.1)
Sodium: 131 mmol/L — ABNORMAL LOW (ref 135–145)

## 2018-10-02 LAB — GLUCOSE, CAPILLARY
GLUCOSE-CAPILLARY: 153 mg/dL — AB (ref 70–99)
GLUCOSE-CAPILLARY: 175 mg/dL — AB (ref 70–99)
Glucose-Capillary: 156 mg/dL — ABNORMAL HIGH (ref 70–99)
Glucose-Capillary: 131 mg/dL — ABNORMAL HIGH (ref 70–99)

## 2018-10-02 LAB — OSMOLALITY, URINE: Osmolality, Ur: 266 mOsm/kg — ABNORMAL LOW (ref 300–900)

## 2018-10-02 MED ORDER — ZOLPIDEM TARTRATE 5 MG PO TABS
10.0000 mg | ORAL_TABLET | Freq: Every day | ORAL | Status: DC
  Administered 2018-10-02 – 2018-10-06 (×5): 10 mg via ORAL
  Filled 2018-10-02 (×5): qty 2

## 2018-10-02 MED ORDER — STERILE WATER FOR INJECTION IV SOLN
INTRAVENOUS | Status: DC
  Administered 2018-10-03: via INTRAVENOUS
  Filled 2018-10-02 (×5): qty 850

## 2018-10-02 NOTE — Progress Notes (Addendum)
PROGRESS NOTE    KIERNAN ATKERSON  DUK:[id number] DOB: [birth year] DOA: 09/28/2018 PCP: Wenda Low, MD    Brief Narrative:  63 year old male who presented with worsening renal function. He does have diabetes mellitus, hypertension and dyslipidemia. Apparently his kidney function was normal 3 months ago, he follow-up with his primary care provider and his kidney function was significantly abnormal with a serum creatinine of 6. He reported having nausea and vomiting for the last 3 to 4 days prior to hospitalization, associated with a vague abdominal discomfort in the left upper quadrant. No diarrhea. No new medication changes or over-the-counter agents. On his initial physical examination his blood pressure was 128/74, pulse rate 84, respiratory rate26, oxygen saturation 98%, he had moist mucous membranes, lungs clear to auscultation bilaterally, heartS1-S2Present andrhythm, abdomen soft, no lower extremity edema. Sodium 135 potassium 5.7, chloride 95, bicarb 18, glucose 145, BUN 73, creatinine 6.8, lipase 41, white count 14.7, hemoglobin 14.5, hematocrit 35.5, platelets 307.Urinalysis with greater than500glucose, 100 protein, specific gravity 1.016.   Patient was admitted to the hospital with the working diagnosis of acute kidney injury, complicated with hyperkalemia and non gap metabolic acidosis.   Assessment & Plan:   Principal Problem:   ARF (acute renal failure) (HCC) Active Problems:   Essential hypertension   DM2 (diabetes mellitus, type 2) ([place])  1. AKI, possible ATN. positive anion gap metabolic acidosis. Serum creatine continue to rise, today up to 14.8 with down to K at 4,3 and serum bicarbonate at 17. Noted persistent P elevation and anion gap at 24. complement. Will check osmolar gap to complete work up. Pending renal biopsy. His urine output has been increasing, today is up to 3,125, possible polyuric phase of acute tubular necrosis. His complement have been  essentially normal, urine analysis bland. No urine eosinophils. Continue lokelma (sodium zirconuim) and sodium bicarbonate.   2. HTN. Continue amlodipine for blood pressure control. Will discontinue telemetry monitoring.  3. T2DM. Today's fasting glucose this am is 157. Glucose cover and monitoring with insulin sliding scale.   4. Depression/ insomnia. Continue bupropion and resume ambien.   5.  Diarrhea. Continue with as needed loperamide.   DVT prophylaxis:scd Code Status:full Family Communication:no family at the bedside Disposition Plan/ discharge barriers:pending renal biopsy and renal function improvent.    Body mass index is 27.95 kg/m. Malnutrition Type:      Malnutrition Characteristics:      Nutrition Interventions:     RN Pressure Injury Documentation:     Consultants:   Nephrology   Procedures:     Antimicrobials:       Subjective: Patient is feeling well, no nausea or vomiting, diarrhea has improved, no chest pain. Asking about removing foley catheter.   Objective: Vitals:   10/01/18 2028 10/02/18 0429 10/02/18 0436 10/02/18 0829  BP: 134/80 138/83  (!) 150/88  Pulse: 89 88  87  Resp: 18 18    Temp: 98.2 F (36.8 C) 99 F (37.2 C)    TempSrc: Oral Oral    SpO2: 97% 97%    Weight:   88.4 kg   Height:        Intake/Output Summary (Last 24 hours) at 10/02/2018 1150 Last data filed at 10/02/2018 0845 Gross per 24 hour  Intake 960 ml  Output 3625 ml  Net -2665 ml   Filed Weights   09/29/18 0020 10/01/18 0500 10/02/18 0436  Weight: 89.6 kg 90.3 kg 88.4 kg    Examination:   General: Not in pain or  dyspnea.  Neurology: Awake and alert, non focal  E ENT: mild pallor, no icterus, oral mucosa moist Cardiovascular: No JVD. S1-S2 present, rhythmic, no gallops, rubs, or murmurs. Trace pitting lower extremity edema. Pulmonary: vesicular breath sounds bilaterally, adequate air movement, no wheezing, rhonchi or rales.  Gastrointestinal. Abdomen with no organomegaly, non tender, no rebound or guarding Skin. No rashes Musculoskeletal: no joint deformities     Data Reviewed: I have personally reviewed following labs and imaging studies  CBC: Recent Labs  Lab 09/28/18 1343 09/29/18 0508 09/30/18 0535  WBC 14.7* 13.0* 12.0*  HGB 14.5 13.9 13.3  HCT 45.5 41.8 39.5  MCV 92.3 88.7 88.2  PLT 307 270 010   Basic Metabolic Panel: Recent Labs  Lab 09/28/18 1343 09/28/18 1732 09/29/18 0508 09/30/18 0535 10/01/18 0431 10/02/18 0413  NA 135  --  132* 133* 133* 131*  K 5.7*  --  5.4* 5.1 5.1 4.3  CL 95*  --  95* 93* 93* 90*  CO2 18*  --  16* 16* 16* 17*  GLUCOSE 145*  --  242* 140* 133* 157*  BUN 73*  --  87* 108* 127* 142*  CREATININE 6.80*  --  8.88* 11.72* 13.82* 14.87*  CALCIUM 9.2  --  8.4* 8.2* 8.1* 8.0*  MG  --  2.5*  --   --   --   --   PHOS  --   --  9.9* >30.0* >30.0* >30.1*   GFR: Estimated Creatinine Clearance: 5.8 mL/min (A) (by C-G formula based on SCr of 14.87 mg/dL (H)). Liver Function Tests: Recent Labs  Lab 09/28/18 1343 09/29/18 0508 09/30/18 0535 10/01/18 0431 10/02/18 0413  AST 29  --   --   --   --   ALT 25  --   --   --   --   ALKPHOS 55  --   --   --   --   BILITOT 1.0  --   --   --   --   PROT 6.6  --   --   --   --   ALBUMIN 3.9 3.4* 3.1* 3.4* 3.4*   Recent Labs  Lab 09/28/18 1343  LIPASE 41   No results for input(s): AMMONIA in the last 168 hours. Coagulation Profile: Recent Labs  Lab 09/30/18 0535  INR 1.0   Cardiac Enzymes: Recent Labs  Lab 09/28/18 2337  CKTOTAL 110   BNP (last 3 results) No results for input(s): PROBNP in the last 8760 hours. HbA1C: No results for input(s): HGBA1C in the last 72 hours. CBG: Recent Labs  Lab 10/01/18 0613 10/01/18 1139 10/01/18 1700 10/01/18 2113 10/02/18 0619  GLUCAP 128* 210* 119* 157* 153*   Lipid Profile: No results for input(s): CHOL, HDL, LDLCALC, TRIG, CHOLHDL, LDLDIRECT in the last 72  hours. Thyroid Function Tests: No results for input(s): TSH, T4TOTAL, FREET4, T3FREE, THYROIDAB in the last 72 hours. Anemia Panel: No results for input(s): VITAMINB12, FOLATE, FERRITIN, TIBC, IRON, RETICCTPCT in the last 72 hours.    Radiology Studies: I have reviewed all of the imaging during this hospital visit personally     Scheduled Meds: . amLODipine  10 mg Oral QPM  . buPROPion  300 mg Oral Daily  . diphenhydrAMINE  25 mg Oral QHS  . insulin aspart  0-9 Units Subcutaneous TID WC  . sodium bicarbonate  1,300 mg Oral BID  . sodium zirconium cyclosilicate  10 g Oral Daily   Continuous Infusions:   LOS: 4  days        Kaida Games Gerome Apley, MD

## 2018-10-02 NOTE — Progress Notes (Signed)
[facility] ROUNDING NOTE   Subjective:   History this is a 63 year old gentleman with a longstanding history of type 2 diabetes and hypertension he had normal serum creatinine last year.  2019.  It appears that he has developed acute kidney injury.  There does not appear to be any nephrotoxins of the was on ARB losartan , metformin and omeprazole.  He was asked to come to the emergency room due to abnormal labs.  Underwent renal biopsy 09/30/2018 results pending  Blood pressure 133/82 pulse 87 temperature 98 O2 sats 98% room air  Sodium 131 potassium 4.3 chloride 90 CO2 17 BUN 142 creatinine 14.87 calcium 8.0 phosphorus greater than 30 albumin 3.4 glucose 157.  M spike not observed.  Uric acid 9.1 CPK 110    Urine output 3000 cc of urine 10/02/2018.  Weight down to 88.4 kg.  Appears to have polyuria.  We shall proceed with starting some IV fluids.  This may be postobstructive diuresis.  All may be post ATN diuresis.  Nevertheless this is puzzling.  Foley catheter placed with renal ultrasound showing normal-sized kidneys.  He had a small right pleural effusion associated with atelectasis on chest x-ray  Amlodipine 10 mg daily, Wellbutrin 300 mg daily Ambien 5 mg nightly insulin sliding scale sodium bicarbonate 1.3 g twice daily  Objective:  Vital signs in last 24 hours:  Temp:  [98 F (36.7 C)-99 F (37.2 C)] 98 F (36.7 C) (03/14 2100) Pulse Rate:  [83-88] 87 (03/14 2100) Resp:  [16-18] 16 (03/14 2100) BP: (133-150)/(82-88) 133/82 (03/14 2100) SpO2:  [97 %-98 %] 98 % (03/14 2100) Weight:  [88.4 kg] 88.4 kg (03/14 0436)  Weight change: -1.905 kg Filed Weights   09/29/18 0020 10/01/18 0500 10/02/18 0436  Weight: 89.6 kg 90.3 kg 88.4 kg    Intake/Output: I/O last 3 completed shifts: In: 1941 [P.O.:1760] Out: 7408 [Urine:4975]   Intake/Output this shift:  No intake/output data recorded. Pleasant gentleman no obvious distress resting comfortably in bed dry mucous  membranes CVS-regular rate and rhythm no murmurs rubs gallops RS- CTA no wheezes rales ABD- BS present soft non-distended EXT- no edema   Basic Metabolic Panel: Recent Labs  Lab 09/28/18 1343 09/28/18 1732 09/29/18 0508 09/30/18 0535 10/01/18 0431 10/02/18 0413  NA 135  --  132* 133* 133* 131*  K 5.7*  --  5.4* 5.1 5.1 4.3  CL 95*  --  95* 93* 93* 90*  CO2 18*  --  16* 16* 16* 17*  GLUCOSE 145*  --  242* 140* 133* 157*  BUN 73*  --  87* 108* 127* 142*  CREATININE 6.80*  --  8.88* 11.72* 13.82* 14.87*  CALCIUM 9.2  --  8.4* 8.2* 8.1* 8.0*  MG  --  2.5*  --   --   --   --   PHOS  --   --  9.9* >30.0* >30.0* >30.1*    Liver Function Tests: Recent Labs  Lab 09/28/18 1343 09/29/18 0508 09/30/18 0535 10/01/18 0431 10/02/18 0413  AST 29  --   --   --   --   ALT 25  --   --   --   --   ALKPHOS 55  --   --   --   --   BILITOT 1.0  --   --   --   --   PROT 6.6  --   --   --   --   ALBUMIN 3.9 3.4* 3.1* 3.4*  3.4*   Recent Labs  Lab 09/28/18 1343  LIPASE 41   No results for input(s): AMMONIA in the last 168 hours.  CBC: Recent Labs  Lab 09/28/18 1343 09/29/18 0508 09/30/18 0535  WBC 14.7* 13.0* 12.0*  HGB 14.5 13.9 13.3  HCT 45.5 41.8 39.5  MCV 92.3 88.7 88.2  PLT 307 270 259    Cardiac Enzymes: Recent Labs  Lab 09/28/18 2337  CKTOTAL 110    BNP: Invalid input(s): POCBNP  CBG: Recent Labs  Lab 10/01/18 2113 10/02/18 0619 10/02/18 1252 10/02/18 1625 10/02/18 2121  GLUCAP 157* 153* 156* 131* 175*    Microbiology: No results found for this or any previous visit.  Coagulation Studies: Recent Labs    09/30/18 0535  LABPROT 13.5  INR 1.0    Urinalysis: No results for input(s): COLORURINE, LABSPEC, PHURINE, GLUCOSEU, HGBUR, BILIRUBINUR, KETONESUR, PROTEINUR, UROBILINOGEN, NITRITE, LEUKOCYTESUR in the last 72 hours.  Invalid input(s): APPERANCEUR    Imaging: No results found.   Medications:    . amLODipine  10 mg Oral QPM  .  buPROPion  300 mg Oral Daily  . insulin aspart  0-9 Units Subcutaneous TID WC  . sodium bicarbonate  1,300 mg Oral BID  . sodium zirconium cyclosilicate  10 g Oral Daily  . zolpidem  10 mg Oral QHS   acetaminophen **OR** acetaminophen, diphenoxylate-atropine, hydrALAZINE  Assessment/ Plan:   Acute kidney injury in the setting of diabetes.  He does not have significant proteinuria and renal failure has occurred very rapidly.  He was using a proton pump inhibitor and this has been associated with acute interstitial nephritis urine he has 6-10 WBCs per high-powered film.  CK was 110.  Serologies and complements were pending.  Screening for multiple myeloma with protein electrophoresis and immunofixation are pending.  Evaluation for vasculitis with ANCA and anti-GBM.  Hepatitis B and C- negative HIV status pending, ASO was negative complements appear to be within normal range although C3 was a little bit lower than normal.  ANCA negative ASO 39 antimyeloperoxidase antibodies negative ANA negative.  His creatinine appears to be worsening although he is having a quite profound diuresis.  We should continue to monitor his output and I will start him on some IV fluids sodium bicarbonate 125 cc an hour  Metabolic acidosis we shall start IV fluids with sodium bicarbonate at rate of 125 cc an hour.  Mild hyperkalemia improved  Diabetes mellitus as per primary team  Depression continues Wellbutrin daily  Hypertension volume appears adequately controlled may have some slight volume depletion with some dry mucous membranes continue to push fluids.  Continues on amlodipine 10 mg daily.  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

## 2018-10-03 DIAGNOSIS — E872 Acidosis: Secondary | ICD-10-CM

## 2018-10-03 LAB — RENAL FUNCTION PANEL
ANION GAP: 25 — AB (ref 5–15)
Albumin: 3.4 g/dL — ABNORMAL LOW (ref 3.5–5.0)
BUN: 143 mg/dL — ABNORMAL HIGH (ref 8–23)
CO2: 20 mmol/L — ABNORMAL LOW (ref 22–32)
Calcium: 8 mg/dL — ABNORMAL LOW (ref 8.9–10.3)
Chloride: 89 mmol/L — ABNORMAL LOW (ref 98–111)
Creatinine, Ser: 14.37 mg/dL — ABNORMAL HIGH (ref 0.61–1.24)
GFR calc Af Amer: 4 mL/min — ABNORMAL LOW (ref >60)
GFR calc non Af Amer: 3 mL/min — ABNORMAL LOW (ref >60)
Glucose, Bld: 136 mg/dL — ABNORMAL HIGH (ref 70–99)
PHOSPHORUS: 11.8 mg/dL — AB (ref 2.5–4.6)
Potassium: 3.7 mmol/L (ref 3.5–5.1)
SODIUM: 134 mmol/L — AB (ref 135–145)

## 2018-10-03 LAB — GLUCOSE, CAPILLARY
Glucose-Capillary: 137 mg/dL — ABNORMAL HIGH (ref 70–99)
Glucose-Capillary: 247 mg/dL — ABNORMAL HIGH (ref 70–99)
Glucose-Capillary: 142 mg/dL — ABNORMAL HIGH (ref 70–99)
Glucose-Capillary: 171 mg/dL — ABNORMAL HIGH (ref 70–99)

## 2018-10-03 LAB — OSMOLALITY: OSMOLALITY: 324 mosm/kg — AB (ref 275–295)

## 2018-10-03 MED ORDER — STERILE WATER FOR INJECTION IV SOLN
INTRAVENOUS | Status: DC
  Administered 2018-10-03 – 2018-10-04 (×2): via INTRAVENOUS
  Filled 2018-10-03 (×3): qty 850

## 2018-10-03 MED ORDER — SODIUM BICARBONATE 650 MG PO TABS: 1300.0000 mg | ORAL_TABLET | Freq: Two times a day (BID) | ORAL | Status: DC

## 2018-10-03 MED ORDER — STERILE WATER FOR INJECTION IV SOLN
INTRAVENOUS | Status: DC
  Administered 2018-10-03: 12:00:00 via INTRAVENOUS
  Filled 2018-10-03 (×3): qty 850

## 2018-10-03 NOTE — Progress Notes (Signed)
Call received from lab for critical [facility] [address]. Page to Dr Cathlean Sauer at [phone]

## 2018-10-03 NOTE — Progress Notes (Signed)
PROGRESS NOTE    Shawn Yu  ATF:[id number] DOB: 1956-01-29 DOA: 09/28/2018 PCP: Wenda Low, MD    Brief Narrative:  63 year old male who presented with worsening renal function. He does have diabetes mellitus, hypertension and dyslipidemia. Apparently his kidney function was normal 3 months ago, he follow-up with his primary care provider and his kidney function was significantly abnormal with a serum creatinine of 6. He reported having nausea and vomiting for the last 3 to 4 days prior to hospitalization, associated with a vague abdominal discomfort in the left upper quadrant. No diarrhea. No new medication changes or over-the-counter agents. On his initial physical examination his blood pressure was 128/74, pulse rate 84, respiratory rate26, oxygen saturation 98%, he had moist mucous membranes, lungs clear to auscultation bilaterally, heartS1-S2Present andrhythm, abdomen soft, no lower extremity edema. Sodium 135 potassium 5.7, chloride 95, bicarb 18, glucose 145, BUN 73, creatinine 6.8, lipase 41, white count 14.7, hemoglobin 14.5, hematocrit 35.5, platelets 307.Urinalysis with greater than500glucose, 100 protein, specific gravity 1.016.   Patient was admitted to the hospital with the working diagnosis of acute kidney injury, complicated with hyperkalemia and non gap metabolic acidosis.   Assessment & Plan:   Principal Problem:   ARF (acute renal failure) (HCC) Active Problems:   Essential hypertension   DM2 (diabetes mellitus, type 2) ([place])   1. AKI,possible ATN.positive anion gap metabolic acidosis. His renal function is improving with urine output up to 4,150 ml over last 24 H, his creatinine has plateau at 14,3, no signs of clinical uremia despite BUN 143. No signs of hypervolemia. Serum K is down to 3,7 and serum bicarbonate is up to 20. Will hold on lokelma and will continue sodium bicarbonate, per nephrology recommendations. All serologies have been  negative, pending renal biopsy. There is no significant calculated osmolar gap.      2. HTN.On amlodipine for blood pressure control. Now off telemetry.  3. T2DM.Glucose remains well controlled with fasting 136. Continue glucose cover and monitoring with insulin sliding scale.   4. Depression/ insomnia.Onbupropion and ambien.   5.  Diarrhea.  loperamide as needed  DVT prophylaxis:scd Code Status:full Family Communication:no family at the bedside Disposition Plan/ discharge barriers:pending renal biopsy and renal function improvent.    Body mass index is 27.95 kg/m. Malnutrition Type:      Malnutrition Characteristics:      Nutrition Interventions:     RN Pressure Injury Documentation:     Consultants:   Nephrology   Procedures:     Antimicrobials:       Subjective: This am patient is feeling better, no nausea or vomiting, no confusion or somnolence, no chest pain or dyspnea. Tolerating po well.   Objective: Vitals:   10/02/18 0829 10/02/18 1659 10/02/18 2100 10/03/18 0536  BP: (!) 150/88 140/86 133/82 129/81  Pulse: 87 83 87 79  Resp:   16 16  Temp:   98 F (36.7 C) 97.9 F (36.6 C)  TempSrc:   Oral Oral  SpO2:   98% 98%  Weight:      Height:        Intake/Output Summary (Last 24 hours) at 10/03/2018 0922 Last data filed at 10/03/2018 0803 Gross per 24 hour  Intake 1508.18 ml  Output 3650 ml  Net -2141.82 ml   Filed Weights   09/29/18 0020 10/01/18 0500 10/02/18 0436  Weight: 89.6 kg 90.3 kg 88.4 kg    Examination:   General: Not in pain or dyspnea  Neurology: Awake and alert, non  focal  E ENT: no pallor, no icterus, oral mucosa moist Cardiovascular: No JVD. S1-S2 present, rhythmic, no gallops, rubs, or murmurs. No lower extremity edema. Pulmonary: vesicular breath sounds bilaterally, adequate air movement, no wheezing, rhonchi or rales. Gastrointestinal. Abdomen with no organomegaly, non tender, no rebound or  guarding Skin. No rashes Musculoskeletal: no joint deformities     Data Reviewed: I have personally reviewed following labs and imaging studies  CBC: Recent Labs  Lab 09/28/18 1343 09/29/18 0508 09/30/18 0535  WBC 14.7* 13.0* 12.0*  HGB 14.5 13.9 13.3  HCT 45.5 41.8 39.5  MCV 92.3 88.7 88.2  PLT 307 270 947   Basic Metabolic Panel: Recent Labs  Lab 09/28/18 1732 09/29/18 0508 09/30/18 0535 10/01/18 0431 10/02/18 0413 10/03/18 0453  NA  --  132* 133* 133* 131* 134*  K  --  5.4* 5.1 5.1 4.3 3.7  CL  --  95* 93* 93* 90* 89*  CO2  --  16* 16* 16* 17* 20*  GLUCOSE  --  242* 140* 133* 157* 136*  BUN  --  87* 108* 127* 142* 143*  CREATININE  --  8.88* 11.72* 13.82* 14.87* 14.37*  CALCIUM  --  8.4* 8.2* 8.1* 8.0* 8.0*  MG 2.5*  --   --   --   --   --   PHOS  --  9.9* >30.0* >30.0* >30.1* 11.8*   GFR: Estimated Creatinine Clearance: 6 mL/min (A) (by C-G formula based on SCr of 14.37 mg/dL (H)). Liver Function Tests: Recent Labs  Lab 09/28/18 1343 09/29/18 0508 09/30/18 0535 10/01/18 0431 10/02/18 0413 10/03/18 0453  AST 29  --   --   --   --   --   ALT 25  --   --   --   --   --   ALKPHOS 55  --   --   --   --   --   BILITOT 1.0  --   --   --   --   --   PROT 6.6  --   --   --   --   --   ALBUMIN 3.9 3.4* 3.1* 3.4* 3.4* 3.4*   Recent Labs  Lab 09/28/18 1343  LIPASE 41   No results for input(s): AMMONIA in the last 168 hours. Coagulation Profile: Recent Labs  Lab 09/30/18 0535  INR 1.0   Cardiac Enzymes: Recent Labs  Lab 09/28/18 2337  CKTOTAL 110   BNP (last 3 results) No results for input(s): PROBNP in the last 8760 hours. HbA1C: No results for input(s): HGBA1C in the last 72 hours. CBG: Recent Labs  Lab 10/02/18 0619 10/02/18 1252 10/02/18 1625 10/02/18 2121 10/03/18 0623  GLUCAP 153* 156* 131* 175* 137*   Lipid Profile: No results for input(s): CHOL, HDL, LDLCALC, TRIG, CHOLHDL, LDLDIRECT in the last 72 hours. Thyroid Function  Tests: No results for input(s): TSH, T4TOTAL, FREET4, T3FREE, THYROIDAB in the last 72 hours. Anemia Panel: No results for input(s): VITAMINB12, FOLATE, FERRITIN, TIBC, IRON, RETICCTPCT in the last 72 hours.    Radiology Studies: I have reviewed all of the imaging during this hospital visit personally     Scheduled Meds: . amLODipine  10 mg Oral QPM  . buPROPion  300 mg Oral Daily  . insulin aspart  0-9 Units Subcutaneous TID WC  . zolpidem  10 mg Oral QHS   Continuous Infusions: .  sodium bicarbonate (isotonic) infusion in sterile water  LOS: 5 days        Lamondre Wesche Gerome Apley, MD

## 2018-10-03 NOTE — Progress Notes (Signed)
[facility] ROUNDING NOTE   Subjective:   History this is a 63 year old gentleman with a longstanding history of type 2 diabetes and hypertension he had normal serum creatinine last year.  2019.  It appears that he has developed acute kidney injury.  There does not appear to be any nephrotoxins of the was on ARB losartan , metformin and omeprazole.  He was asked to come to the emergency room due to abnormal labs.  Underwent renal biopsy 09/30/2018 results pending  Blood pressure 139/88 pulse 80 temperature 97.8 O2 sats 99% room air  Sodium 134 potassium 3.7 chloride 89 CO2 20 BUN 143 creatinine 14.37 glucose 136 calcium 8.0 phosphorus 11.8 albumin 3.4  Urine output 4350 cc 10/02/2018 weight down to 88.4 kg.  Appears to have polyuria.  We shall proceed with starting some IV fluids.  This may be postobstructive diuresis or may be post ATN diuresis.  Nevertheless this is puzzling.  Patient has had some prostatism complaints in the past  Foley catheter placed with renal ultrasound showing normal-sized kidneys.  He had a small right pleural effusion associated with atelectasis on chest x-ray  Amlodipine 10 mg daily, Wellbutrin 300 mg daily Ambien 5 mg nightly insulin sliding scale sodium bicarbonate 1.3 g twice daily  Objective:  Vital signs in last 24 hours:  Temp:  [97.8 F (36.6 C)-98 F (36.7 C)] 97.8 F (36.6 C) (03/15 1135) Pulse Rate:  [79-87] 80 (03/15 1135) Resp:  [16-20] 20 (03/15 1135) BP: (129-140)/(81-88) 139/88 (03/15 1135) SpO2:  [98 %-99 %] 99 % (03/15 1135)  Weight change:  Filed Weights   09/29/18 0020 10/01/18 0500 10/02/18 0436  Weight: 89.6 kg 90.3 kg 88.4 kg    Intake/Output: I/O last 3 completed shifts: In: 2288.8 [P.O.:1610; I.V.:678.8] Out: 5465 [Urine:6050]   Intake/Output this shift:  Total I/O In: 419.4 [P.O.:240; I.V.:179.4] Out: 900 [Urine:900] Pleasant gentleman no obvious distress resting comfortably in bed dry mucous  membranes CVS-regular rate and rhythm no murmurs rubs gallops RS- CTA no wheezes rales ABD- BS present soft non-distended EXT- no edema   Basic Metabolic Panel: Recent Labs  Lab 09/28/18 1732 09/29/18 0508 09/30/18 0535 10/01/18 0431 10/02/18 0413 10/03/18 0453  NA  --  132* 133* 133* 131* 134*  K  --  5.4* 5.1 5.1 4.3 3.7  CL  --  95* 93* 93* 90* 89*  CO2  --  16* 16* 16* 17* 20*  GLUCOSE  --  242* 140* 133* 157* 136*  BUN  --  87* 108* 127* 142* 143*  CREATININE  --  8.88* 11.72* 13.82* 14.87* 14.37*  CALCIUM  --  8.4* 8.2* 8.1* 8.0* 8.0*  MG 2.5*  --   --   --   --   --   PHOS  --  9.9* >30.0* >30.0* >30.1* 11.8*    Liver Function Tests: Recent Labs  Lab 09/28/18 1343 09/29/18 0508 09/30/18 0535 10/01/18 0431 10/02/18 0413 10/03/18 0453  AST 29  --   --   --   --   --   ALT 25  --   --   --   --   --   ALKPHOS 55  --   --   --   --   --   BILITOT 1.0  --   --   --   --   --   PROT 6.6  --   --   --   --   --   ALBUMIN 3.9  3.4* 3.1* 3.4* 3.4* 3.4*   Recent Labs  Lab 09/28/18 1343  LIPASE 41   No results for input(s): AMMONIA in the last 168 hours.  CBC: Recent Labs  Lab 09/28/18 1343 09/29/18 0508 09/30/18 0535  WBC 14.7* 13.0* 12.0*  HGB 14.5 13.9 13.3  HCT 45.5 41.8 39.5  MCV 92.3 88.7 88.2  PLT 307 270 259    Cardiac Enzymes: Recent Labs  Lab 09/28/18 2337  CKTOTAL 110    BNP: Invalid input(s): POCBNP  CBG: Recent Labs  Lab 10/02/18 1252 10/02/18 1625 10/02/18 2121 10/03/18 0623 10/03/18 1132  GLUCAP 156* 131* 175* 137* 247*    Microbiology: No results found for this or any previous visit.  Coagulation Studies: No results for input(s): LABPROT, INR in the last 72 hours.  Urinalysis: No results for input(s): COLORURINE, LABSPEC, PHURINE, GLUCOSEU, HGBUR, BILIRUBINUR, KETONESUR, PROTEINUR, UROBILINOGEN, NITRITE, LEUKOCYTESUR in the last 72 hours.  Invalid input(s): APPERANCEUR    Imaging: No results  found.   Medications:   .  sodium bicarbonate (isotonic) infusion in sterile water 125 mL/hr at 10/03/18 1145   . amLODipine  10 mg Oral QPM  . buPROPion  300 mg Oral Daily  . insulin aspart  0-9 Units Subcutaneous TID WC  . zolpidem  10 mg Oral QHS   acetaminophen **OR** acetaminophen, diphenoxylate-atropine, hydrALAZINE  Assessment/ Plan:   Acute kidney injury in the setting of diabetes.  He does not have significant proteinuria and renal failure has occurred very rapidly.  He was using a proton pump inhibitor and this has been associated with acute interstitial nephritis urine he has 6-10 WBCs per high-powered film.  CK was 110.  Serologies and complements were pending.  Screening for multiple myeloma with serum pregnancy pheresis was negative evaluation for vasculitis with ANCA and anti-GBM.  Hepatitis B and C- negative HIV status pending, ASO was negative complements appear to be within normal range although C3 was a little bit lower than normal.  ANCA negative ASO 39 antimyeloperoxidase antibodies negative ANA negative.  His creatinine appears to be worsening although he is having a quite profound diuresis.  Continue sodium bicarbonate 125 cc an hour.  Metabolic acidosis .  We will continue IV bicarbonate this seems to be improving  Mild hyperkalemia improved  Diabetes mellitus as per primary team  Depression continues Wellbutrin daily  Hypertension volume appears adequately controlled may have some slight volume depletion with some dry mucous membranes continue to push fluids.  Continues on amlodipine 10 mg daily.  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  Will check PSA   LOS: [place] _0 _1 :36 PM

## 2018-10-04 DIAGNOSIS — E876 Hypokalemia: Secondary | ICD-10-CM

## 2018-10-04 LAB — RENAL FUNCTION PANEL
Albumin: 3.5 g/dL (ref 3.5–5.0)
Anion gap: 20 — ABNORMAL HIGH (ref 5–15)
BUN: 133 mg/dL — ABNORMAL HIGH (ref 8–23)
CALCIUM: 7.8 mg/dL — AB (ref 8.9–10.3)
CO2: 29 mmol/L (ref 22–32)
Chloride: 85 mmol/L — ABNORMAL LOW (ref 98–111)
Creatinine, Ser: 11.92 mg/dL — ABNORMAL HIGH (ref 0.61–1.24)
GFR calc Af Amer: 5 mL/min — ABNORMAL LOW (ref >60)
GFR calc non Af Amer: 4 mL/min — ABNORMAL LOW (ref >60)
GLUCOSE: 156 mg/dL — AB (ref 70–99)
Phosphorus: 9.4 mg/dL — ABNORMAL HIGH (ref 2.5–4.6)
Potassium: 2.9 mmol/L — ABNORMAL LOW (ref 3.5–5.1)
Sodium: 134 mmol/L — ABNORMAL LOW (ref 135–145)

## 2018-10-04 LAB — GLUCOSE, CAPILLARY
Glucose-Capillary: 156 mg/dL — ABNORMAL HIGH (ref 70–99)
Glucose-Capillary: 217 mg/dL — ABNORMAL HIGH (ref 70–99)
Glucose-Capillary: 121 mg/dL — ABNORMAL HIGH (ref 70–99)
Glucose-Capillary: 129 mg/dL — ABNORMAL HIGH (ref 70–99)

## 2018-10-04 LAB — PSA, SERUM (SERIAL MONITOR): Prostate Specific Ag, Serum: 1.7 ng/mL (ref 0.0–4.0)

## 2018-10-04 MED ORDER — SODIUM CHLORIDE 0.9 % IV SOLN
INTRAVENOUS | Status: DC
  Administered 2018-10-04 – 2018-10-05 (×4): via INTRAVENOUS

## 2018-10-04 MED ORDER — POTASSIUM CHLORIDE CRYS ER 20 MEQ PO TBCR
40.0000 meq | EXTENDED_RELEASE_TABLET | Freq: Once | ORAL | Status: AC
  Administered 2018-10-04: 40 meq via ORAL
  Filled 2018-10-04: qty 2

## 2018-10-04 NOTE — Progress Notes (Signed)
PROGRESS NOTE    Shawn Yu  VEH:[id number] DOB: August 03, 1955 DOA: 09/28/2018 PCP: Shawn Low, MD    Brief Narrative:  63 year old male who presented with worsening renal function. He does have diabetes mellitus, hypertension and dyslipidemia. Apparently his kidney function was normal 3 months ago, he follow-up with his primary care provider and his kidney function was significantly abnormal with a serum creatinine of 6. He reported having nausea and vomiting for the last 3 to 4 days prior to hospitalization, associated with a vague abdominal discomfort in the left upper quadrant. No diarrhea. No new medication changes or over-the-counter agents. On his initial physical examination his blood pressure was 128/74, pulse rate 84, respiratory rate26, oxygen saturation 98%, he had moist mucous membranes, lungs clear to auscultation bilaterally, heartS1-S2Present andrhythm, abdomen soft, no lower extremity edema. Sodium 135 potassium 5.7, chloride 95, bicarb 18, glucose 145, BUN 73, creatinine 6.8, lipase 41, white count 14.7, hemoglobin 14.5, hematocrit 35.5, platelets 307.Urinalysis with greater than500glucose, 100 protein, specific gravity 1.016.   Patient was admitted to the hospital with the working diagnosis of acute kidney injury, complicated with hyperkalemia and non gap metabolic acidosis.   Assessment & Plan:   Principal Problem:   ARF (acute renal failure) (HCC) Active Problems:   Essential hypertension   DM2 (diabetes mellitus, type 2) ([place])  1. AKI,possible ATN.positive anion gap metabolic acidosis. Renal function continue to improve, suspect patient in polyuric phase of acute tubular necrosis. Urine output up to 5,100 ml, and serum cr down to 11,9 with K at 2,9 and serum bicarbonate at 29. His BUN is down to 133 and P at 9.4. Will continue isotonic saline IV at 100 ml per H, to prevent hypovolemia. Pending renal biopsy results. Add po Kcl, discontinue bicarb  drip.   2. HTN.continue blood pressure control withamlodipine.   3. T2DM.His serum glucose remains well controlled with fasting 156. On insulin sliding scale.   4. Depression/ insomnia.Continue withbupropion and ambien.  5.Diarrhea.Improved.   DVT prophylaxis:scd Code Status:full Family Communication:no family at the bedside Disposition Plan/ discharge barriers:pending renal biopsy and renal function improvent.   Body mass index is 27.62 kg/m. Malnutrition Type:      Malnutrition Characteristics:      Nutrition Interventions:     RN Pressure Injury Documentation:     Consultants:   Nephrology   Procedures:   Renal biopsy   Antimicrobials:       Subjective: Patient is feeling better, no nausea or vomiting, continue to have problems sleeping, but no chest pain or dyspnea.   Objective: Vitals:   10/03/18 2004 10/04/18 0037 10/04/18 0516 10/04/18 1142  BP: (!) 141/81  (!) 148/80 130/79  Pulse: 84  84 79  Resp: 18  17 15   Temp: 98.8 F (37.1 C)  98.8 F (37.1 C) 98.5 F (36.9 C)  TempSrc: Oral  Oral Oral  SpO2: 98%  95% 98%  Weight:  87.3 kg    Height:        Intake/Output Summary (Last 24 hours) at 10/04/2018 1730 Last data filed at 10/04/2018 1700 Gross per 24 hour  Intake 4850.78 ml  Output 6325 ml  Net -1474.22 ml   Filed Weights   10/01/18 0500 10/02/18 0436 10/04/18 0037  Weight: 90.3 kg 88.4 kg 87.3 kg    Examination:   General: Not in pain or dyspnea Neurology: Awake and alert, non focal  E ENT: mild pallor, no icterus, oral mucosa moist Cardiovascular: No JVD. S1-S2 present, rhythmic, no gallops,  rubs, or murmurs. No lower extremity edema. Pulmonary: positive breath sounds bilaterally, adequate air movement, no wheezing, rhonchi or rales. Gastrointestinal. Abdomen with, no organomegaly, non tender, no rebound or guarding Skin. No rashes Musculoskeletal: no joint deformities     Data Reviewed: I have  personally reviewed following labs and imaging studies  CBC: Recent Labs  Lab 09/28/18 1343 09/29/18 0508 09/30/18 0535  WBC 14.7* 13.0* 12.0*  HGB 14.5 13.9 13.3  HCT 45.5 41.8 39.5  MCV 92.3 88.7 88.2  PLT 307 270 962   Basic Metabolic Panel: Recent Labs  Lab 09/28/18 1732  09/30/18 0535 10/01/18 0431 10/02/18 0413 10/03/18 0453 10/04/18 0548  NA  --    < > 133* 133* 131* 134* 134*  K  --    < > 5.1 5.1 4.3 3.7 2.9*  CL  --    < > 93* 93* 90* 89* 85*  CO2  --    < > 16* 16* 17* 20* 29  GLUCOSE  --    < > 140* 133* 157* 136* 156*  BUN  --    < > 108* 127* 142* 143* 133*  CREATININE  --    < > 11.72* 13.82* 14.87* 14.37* 11.92*  CALCIUM  --    < > 8.2* 8.1* 8.0* 8.0* 7.8*  MG 2.5*  --   --   --   --   --   --   PHOS  --    < > >30.0* >30.0* >30.1* 11.8* 9.4*   < > = values in this interval not displayed.   GFR: Estimated Creatinine Clearance: 6.6 mL/min (A) (by C-G formula based on SCr of 11.92 mg/dL (H)). Liver Function Tests: Recent Labs  Lab 09/28/18 1343  09/30/18 0535 10/01/18 0431 10/02/18 0413 10/03/18 0453 10/04/18 0548  AST 29  --   --   --   --   --   --   ALT 25  --   --   --   --   --   --   ALKPHOS 55  --   --   --   --   --   --   BILITOT 1.0  --   --   --   --   --   --   PROT 6.6  --   --   --   --   --   --   ALBUMIN 3.9   < > 3.1* 3.4* 3.4* 3.4* 3.5   < > = values in this interval not displayed.   Recent Labs  Lab 09/28/18 1343  LIPASE 41   No results for input(s): AMMONIA in the last 168 hours. Coagulation Profile: Recent Labs  Lab 09/30/18 0535  INR 1.0   Cardiac Enzymes: Recent Labs  Lab 09/28/18 2337  CKTOTAL 110   BNP (last 3 results) No results for input(s): PROBNP in the last 8760 hours. HbA1C: No results for input(s): HGBA1C in the last 72 hours. CBG: Recent Labs  Lab 10/03/18 1639 10/03/18 2002 10/04/18 0622 10/04/18 1140 10/04/18 1701  GLUCAP 142* 171* 156* 217* 121*   Lipid Profile: No results for  input(s): CHOL, HDL, LDLCALC, TRIG, CHOLHDL, LDLDIRECT in the last 72 hours. Thyroid Function Tests: No results for input(s): TSH, T4TOTAL, FREET4, T3FREE, THYROIDAB in the last 72 hours. Anemia Panel: No results for input(s): VITAMINB12, FOLATE, FERRITIN, TIBC, IRON, RETICCTPCT in the last 72 hours.    Radiology Studies: I have reviewed all of the imaging  during this hospital visit personally     Scheduled Meds: . amLODipine  10 mg Oral QPM  . buPROPion  300 mg Oral Daily  . insulin aspart  0-9 Units Subcutaneous TID WC  . zolpidem  10 mg Oral QHS   Continuous Infusions: . sodium chloride 100 mL/hr at 10/04/18 0918     LOS: 6 days        Shawn Gerome Apley, MD

## 2018-10-04 NOTE — Progress Notes (Signed)
k 2.9 on labs, paged Dr Cathlean Sauer to advise of results, await orders

## 2018-10-04 NOTE — Progress Notes (Signed)
Subjective:  Kidney numbers improving very gradually, moreso last 24 hours - 5 liters of UOP  Objective Vital signs in last 24 hours: Vitals:   10/03/18 1135 10/03/18 2004 10/04/18 0037 10/04/18 0516  BP: 139/88 (!) 141/81  (!) 148/80  Pulse: 80 84  84  Resp: 20 18  17   Temp: 97.8 F (36.6 C) 98.8 F (37.1 C)  98.8 F (37.1 C)  TempSrc: Oral Oral  Oral  SpO2: 99% 98%  95%  Weight:   87.3 kg   Height:       Weight change:   Intake/Output Summary (Last 24 hours) at 10/04/2018 0920 Last data filed at 10/04/2018 0900 Gross per 24 hour  Intake 3493.57 ml  Output 5600 ml  Net -2106.43 ml    Assessment/ Plan: Pt is a 63 y.o. yo male with DM, HTN with normal crt in 2017 who was admitted on 09/28/2018 with what appears to be AKI-  Assessment/Plan: 1. Renal- normal creatinine in 2017- now with AKI- on ARB and metformin.  Work up really seems negative- seemed to have 4 grams of protein but only 100 on dip and relatively normal serum albumin- 6-10 WBC.  Some indication of BOO but no hydro on u/s. S/p biopsy ( no results yet)  but then renal function started to recover- great UOP - post injury diuresis.  Feeling better, no change in plan today  2. HTN/vol- not overloaded BP is not low- giving back NS at 100 per hour given polyuria 3. Anemia- not an issue 4. Hypokalemia- will give another dose for total of 80 meg today   Tailor Lucking Felicity Coyer    Labs: Basic Metabolic Panel: Recent Labs  Lab 10/02/18 0413 10/03/18 0453 10/04/18 0548  NA 131* 134* 134*  K 4.3 3.7 2.9*  CL 90* 89* 85*  CO2 17* 20* 29  GLUCOSE 157* 136* 156*  BUN 142* 143* 133*  CREATININE 14.87* 14.37* 11.92*  CALCIUM 8.0* 8.0* 7.8*  PHOS >30.1* 11.8* 9.4*   Liver Function Tests: Recent Labs  Lab 09/28/18 1343  10/02/18 0413 10/03/18 0453 10/04/18 0548  AST 29  --   --   --   --   ALT 25  --   --   --   --   ALKPHOS 55  --   --   --   --   BILITOT 1.0  --   --   --   --   PROT 6.6  --   --   --   --    ALBUMIN 3.9   < > 3.4* 3.4* 3.5   < > = values in this interval not displayed.   Recent Labs  Lab 09/28/18 1343  LIPASE 41   No results for input(s): AMMONIA in the last 168 hours. CBC: Recent Labs  Lab 09/28/18 1343 09/29/18 0508 09/30/18 0535  WBC 14.7* 13.0* 12.0*  HGB 14.5 13.9 13.3  HCT 45.5 41.8 39.5  MCV 92.3 88.7 88.2  PLT 307 270 259   Cardiac Enzymes: Recent Labs  Lab 09/28/18 2337  CKTOTAL 110   CBG: Recent Labs  Lab 10/03/18 0623 10/03/18 1132 10/03/18 1639 10/03/18 2002 10/04/18 0622  GLUCAP 137* 247* 142* 171* 156*    Iron Studies: No results for input(s): IRON, TIBC, TRANSFERRIN, FERRITIN in the last 72 hours. Studies/Results: No results found. Medications: Infusions: . sodium chloride 100 mL/hr at 10/04/18 2505    Scheduled Medications: . amLODipine  10 mg Oral QPM  . buPROPion  300 mg Oral Daily  . insulin aspart  0-9 Units Subcutaneous TID WC  . zolpidem  10 mg Oral QHS    have reviewed scheduled and prn medications.  Physical Exam: General: NAD Heart: RRR Lungs: mostly clear Abdomen: soft, non tender Extremities: min edema    10/04/2018,9:20 AM  LOS: 6 days

## 2018-10-05 LAB — RENAL FUNCTION PANEL
Albumin: 3.4 g/dL — ABNORMAL LOW (ref 3.5–5.0)
Anion gap: 17 — ABNORMAL HIGH (ref 5–15)
BUN: 109 mg/dL — ABNORMAL HIGH (ref 8–23)
CO2: 24 mmol/L (ref 22–32)
Calcium: 8.1 mg/dL — ABNORMAL LOW (ref 8.9–10.3)
Chloride: 97 mmol/L — ABNORMAL LOW (ref 98–111)
Creatinine, Ser: 9.12 mg/dL — ABNORMAL HIGH (ref 0.61–1.24)
GFR calc Af Amer: 6 mL/min — ABNORMAL LOW (ref >60)
GFR, EST NON AFRICAN AMERICAN: 6 mL/min — AB (ref >60)
Glucose, Bld: 178 mg/dL — ABNORMAL HIGH (ref 70–99)
PHOSPHORUS: 6.8 mg/dL — AB (ref 2.5–4.6)
Potassium: 3.4 mmol/L — ABNORMAL LOW (ref 3.5–5.1)
Sodium: 138 mmol/L (ref 135–145)

## 2018-10-05 LAB — GLUCOSE, CAPILLARY
Glucose-Capillary: 171 mg/dL — ABNORMAL HIGH (ref 70–99)
Glucose-Capillary: 138 mg/dL — ABNORMAL HIGH (ref 70–99)
Glucose-Capillary: 177 mg/dL — ABNORMAL HIGH (ref 70–99)
Glucose-Capillary: 133 mg/dL — ABNORMAL HIGH (ref 70–99)

## 2018-10-05 MED ORDER — POLYETHYLENE GLYCOL 3350 17 G PO PACK
17.0000 g | PACK | Freq: Every day | ORAL | Status: DC
  Administered 2018-10-05 – 2018-10-06 (×2): 17 g via ORAL
  Filled 2018-10-05 (×3): qty 1

## 2018-10-05 MED ORDER — POTASSIUM CHLORIDE CRYS ER 20 MEQ PO TBCR
40.0000 meq | EXTENDED_RELEASE_TABLET | Freq: Once | ORAL | Status: AC
  Administered 2018-10-05: 40 meq via ORAL
  Filled 2018-10-05: qty 2

## 2018-10-05 NOTE — Plan of Care (Signed)

## 2018-10-05 NOTE — Progress Notes (Signed)
Pt voided and  PVR was 349 ml, paged Dr Cathlean Sauer to update

## 2018-10-05 NOTE — Progress Notes (Signed)
Subjective:  Kidney numbers improving  gradually, moreso last 24 hours - 6 liters of UOP.  Biopsy was consistent with acute interstitial nephritis possibly due to PPI ?     Objective Vital signs in last 24 hours: Vitals:   10/04/18 0516 10/04/18 1142 10/04/18 1945 10/05/18 0452  BP: (!) 148/80 130/79 140/84 140/81  Pulse: 84 79 80 82  Resp: 17 15 18 18   Temp: 98.8 F (37.1 C) 98.5 F (36.9 C) 98.4 F (36.9 C) 98.2 F (36.8 C)  TempSrc: Oral Oral Oral Oral  SpO2: 95% 98% 98% 99%  Weight:    86.3 kg  Height:       Weight change: -1.043 kg  Intake/Output Summary (Last 24 hours) at 10/05/2018 0915 Last data filed at 10/05/2018 0757 Gross per 24 hour  Intake 3211 ml  Output 5625 ml  Net -2414 ml    Assessment/ Plan: Pt is a 63 y.o. yo male with DM, HTN with normal crt in 2017 who was admitted on 09/28/2018 with what appears to be AKI-  Assessment/Plan: 1. Renal- normal creatinine in 2017- now with AKI- on ARB and metformin.  Work up really seems negative- seemed to have 4 grams of protein but only 100 on dip and relatively normal serum albumin- 6-10 WBC.  Some indication of BOO but no hydro on u/s. S/p biopsy consistent with AIN thought maybe due to PPI-   renal function starting to recover- great UOP - post injury diuresis.  I think needs to stay in house while making huge amounts of urine b/c not sure can keep up PO intake, also keeping foley in for convenience sake.  Kidney function does not need to be normal to be discharged, possibly discharge in 48-72 hours  2. HTN/vol- not overloaded BP is not low- giving back NS at 100 per hour given polyuria 3. Anemia- not an issue 4. Hypokalemia- being repleted daily   Louis Meckel    Labs: Basic Metabolic Panel: Recent Labs  Lab 10/03/18 0453 10/04/18 0548 10/05/18 0405  NA 134* 134* 138  K 3.7 2.9* 3.4*  CL 89* 85* 97*  CO2 20* 29 24  GLUCOSE 136* 156* 178*  BUN 143* 133* 109*  CREATININE 14.37* 11.92* 9.12*   CALCIUM 8.0* 7.8* 8.1*  PHOS 11.8* 9.4* 6.8*   Liver Function Tests: Recent Labs  Lab 09/28/18 1343  10/03/18 0453 10/04/18 0548 10/05/18 0405  AST 29  --   --   --   --   ALT 25  --   --   --   --   ALKPHOS 55  --   --   --   --   BILITOT 1.0  --   --   --   --   PROT 6.6  --   --   --   --   ALBUMIN 3.9   < > 3.4* 3.5 3.4*   < > = values in this interval not displayed.   Recent Labs  Lab 09/28/18 1343  LIPASE 41   No results for input(s): AMMONIA in the last 168 hours. CBC: Recent Labs  Lab 09/28/18 1343 09/29/18 0508 09/30/18 0535  WBC 14.7* 13.0* 12.0*  HGB 14.5 13.9 13.3  HCT 45.5 41.8 39.5  MCV 92.3 88.7 88.2  PLT 307 270 259   Cardiac Enzymes: Recent Labs  Lab 09/28/18 2337  CKTOTAL 110   CBG: Recent Labs  Lab 10/04/18 0622 10/04/18 1140 10/04/18 1701 10/04/18 2102 10/05/18 8250  GLUCAP 156* 217* 121* 129* 171*    Iron Studies: No results for input(s): IRON, TIBC, TRANSFERRIN, FERRITIN in the last 72 hours. Studies/Results: No results found. Medications: Infusions: . sodium chloride 100 mL/hr at 10/05/18 0615    Scheduled Medications: . amLODipine  10 mg Oral QPM  . buPROPion  300 mg Oral Daily  . insulin aspart  0-9 Units Subcutaneous TID WC  . potassium chloride  40 mEq Oral Once  . zolpidem  10 mg Oral QHS    have reviewed scheduled and prn medications.  Physical Exam: General: NAD Heart: RRR Lungs: mostly clear Abdomen: soft, non tender Extremities: min edema    10/05/2018,9:15 AM  LOS: 7 days

## 2018-10-05 NOTE — Progress Notes (Signed)
Patient has been urinating with no difficulty, will continue to monitor bladder scans through the night shift.

## 2018-10-05 NOTE — Progress Notes (Signed)
PROGRESS NOTE    TAB RYLEE  EZM:[id number] DOB: [birth year] DOA: 09/28/2018 PCP: Wenda Low, MD    Brief Narrative:  63 year old male who presented with worsening renal function. He does have diabetes mellitus, hypertension and dyslipidemia. Apparently his kidney function was normal 3 months ago, he follow-up with his primary care provider and his kidney function was significantly abnormal with a serum creatinine of 6. He reported having nausea and vomiting for the last 3 to 4 days prior to hospitalization, associated with a vague abdominal discomfort in the left upper quadrant. No diarrhea. No new medication changes or over-the-counter agents. On his initial physical examination his blood pressure was 128/74, pulse rate 84, respiratory rate26, oxygen saturation 98%, he had moist mucous membranes, lungs clear to auscultation bilaterally, heartS1-S2Present andrhythm, abdomen soft, no lower extremity edema. Sodium 135 potassium 5.7, chloride 95, bicarb 18, glucose 145, BUN 73, creatinine 6.8, lipase 41, white count 14.7, hemoglobin 14.5, hematocrit 35.5, platelets 307.Urinalysis with greater than500glucose, 100 protein, specific gravity 1.016.   Patient was admitted to the hospital with the working diagnosis of acute kidney injury, complicated with hyperkalemia and non gap metabolic acidosis.  Assessment & Plan:   Principal Problem:   ARF (acute renal failure) (HCC) Active Problems:   Essential hypertension   DM2 (diabetes mellitus, type 2) ([place])   1. AKI,possible ATN.positive anion gap metabolic acidosis and hyper-hypokalemia. Urine output up to 6,125 ml over last 24 h, and serum cr down to 9.2 with K at 3.4 and serum bicarbonate at 24. Renal biopsy still pending, normal complement and serologies have been negative. Will continue isotonic saline at 100 ml, to prevent hypovolemia due to polyuric state. Will remove Foley and place a condom catheter, will assess for any  signs of urinary retention. K correction with Kcl po.   2. HTN.Continue amlodipine for blood pressure control.   3. T2DM.Continue insulin sliding scale, for glucose cover and monitoring.   4. Depression/ insomnia.Onbupropion andambien.  5.Diarrhea.Improved, as needed lomotil.   DVT prophylaxis:scd Code Status:full Family Communication:no family at the bedside Disposition Plan/ discharge barriers:pending renal biopsy and renal function improvent.    DVT prophylaxis: heparin   Code Status:  full Family Communication: no family at the bedside Disposition Plan/ discharge barriers: pending further renal function improvement   Body mass index is 27.29 kg/m. Malnutrition Type:      Malnutrition Characteristics:      Nutrition Interventions:     RN Pressure Injury Documentation:     Consultants:   Nephrology   Procedures:     Antimicrobials:       Subjective: Patient is feeling better, mild discomfort from catheter over last few days, no nausea or vomiting ,no chest pain or dyspnea.   Objective: Vitals:   10/04/18 1142 10/04/18 1945 10/05/18 0452 10/05/18 1257  BP: 130/79 140/84 140/81 137/88  Pulse: 79 80 82 79  Resp: 15 18 18 18   Temp: 98.5 F (36.9 C) 98.4 F (36.9 C) 98.2 F (36.8 C) 98.1 F (36.7 C)  TempSrc: Oral Oral Oral Oral  SpO2: 98% 98% 99% 99%  Weight:   86.3 kg   Height:        Intake/Output Summary (Last 24 hours) at 10/05/2018 1558 Last data filed at 10/05/2018 1534 Gross per 24 hour  Intake 6375.49 ml  Output 5525 ml  Net 850.49 ml   Filed Weights   10/02/18 0436 10/04/18 0037 10/05/18 0452  Weight: 88.4 kg 87.3 kg 86.3 kg    Examination:  General: Not in pain or dyspnea, deconditioned  Neurology: Awake and alert, non focal  E ENT: no pallor, no icterus, oral mucosa moist Cardiovascular: No JVD. S1-S2 present, rhythmic, no gallops, rubs, or murmurs. Trace lower extremity edema. Pulmonary:  positive breath sounds bilaterally, adequate air movement, no wheezing, rhonchi or rales. Gastrointestinal. Abdomen with, no organomegaly, non tender, no rebound or guarding Skin. No rashes Musculoskeletal: no joint deformities     Data Reviewed: I have personally reviewed following labs and imaging studies  CBC: Recent Labs  Lab 09/29/18 0508 09/30/18 0535  WBC 13.0* 12.0*  HGB 13.9 13.3  HCT 41.8 39.5  MCV 88.7 88.2  PLT 270 440   Basic Metabolic Panel: Recent Labs  Lab 09/28/18 1732  10/01/18 0431 10/02/18 0413 10/03/18 0453 10/04/18 0548 10/05/18 0405  NA  --    < > 133* 131* 134* 134* 138  K  --    < > 5.1 4.3 3.7 2.9* 3.4*  CL  --    < > 93* 90* 89* 85* 97*  CO2  --    < > 16* 17* 20* 29 24  GLUCOSE  --    < > 133* 157* 136* 156* 178*  BUN  --    < > 127* 142* 143* 133* 109*  CREATININE  --    < > 13.82* 14.87* 14.37* 11.92* 9.12*  CALCIUM  --    < > 8.1* 8.0* 8.0* 7.8* 8.1*  MG 2.5*  --   --   --   --   --   --   PHOS  --    < > >30.0* >30.1* 11.8* 9.4* 6.8*   < > = values in this interval not displayed.   GFR: Estimated Creatinine Clearance: 8.7 mL/min (A) (by C-G formula based on SCr of 9.12 mg/dL (H)). Liver Function Tests: Recent Labs  Lab 10/01/18 0431 10/02/18 0413 10/03/18 0453 10/04/18 0548 10/05/18 0405  ALBUMIN 3.4* 3.4* 3.4* 3.5 3.4*   No results for input(s): LIPASE, AMYLASE in the last 168 hours. No results for input(s): AMMONIA in the last 168 hours. Coagulation Profile: Recent Labs  Lab 09/30/18 0535  INR 1.0   Cardiac Enzymes: Recent Labs  Lab 09/28/18 2337  CKTOTAL 110   BNP (last 3 results) No results for input(s): PROBNP in the last 8760 hours. HbA1C: No results for input(s): HGBA1C in the last 72 hours. CBG: Recent Labs  Lab 10/04/18 1140 10/04/18 1701 10/04/18 2102 10/05/18 0608 10/05/18 1144  GLUCAP 217* 121* 129* 171* 138*   Lipid Profile: No results for input(s): CHOL, HDL, LDLCALC, TRIG, CHOLHDL,  LDLDIRECT in the last 72 hours. Thyroid Function Tests: No results for input(s): TSH, T4TOTAL, FREET4, T3FREE, THYROIDAB in the last 72 hours. Anemia Panel: No results for input(s): VITAMINB12, FOLATE, FERRITIN, TIBC, IRON, RETICCTPCT in the last 72 hours.    Radiology Studies: I have reviewed all of the imaging during this hospital visit personally     Scheduled Meds: . amLODipine  10 mg Oral QPM  . buPROPion  300 mg Oral Daily  . insulin aspart  0-9 Units Subcutaneous TID WC  . polyethylene glycol  17 g Oral Daily  . zolpidem  10 mg Oral QHS   Continuous Infusions: . sodium chloride 100 mL/hr at 10/05/18 1534     LOS: 7 days        Mauricio Gerome Apley, MD

## 2018-10-05 NOTE — Progress Notes (Signed)
Went to dc foley per order "the kidney doctor said they wanted to keep it in until tomorrowA"i reviewed note and says to keep foley for convenience sake.. paged Dr Cathlean Sauer to clarfy

## 2018-10-06 LAB — RENAL FUNCTION PANEL
Albumin: 3.7 g/dL (ref 3.5–5.0)
Anion gap: 11 (ref 5–15)
BUN: 78 mg/dL — ABNORMAL HIGH (ref 8–23)
CO2: 22 mmol/L (ref 22–32)
Calcium: 8.6 mg/dL — ABNORMAL LOW (ref 8.9–10.3)
Chloride: 106 mmol/L (ref 98–111)
Creatinine, Ser: 5.78 mg/dL — ABNORMAL HIGH (ref 0.61–1.24)
GFR calc Af Amer: 11 mL/min — ABNORMAL LOW (ref >60)
GFR calc non Af Amer: 10 mL/min — ABNORMAL LOW (ref >60)
Glucose, Bld: 122 mg/dL — ABNORMAL HIGH (ref 70–99)
POTASSIUM: 3.8 mmol/L (ref 3.5–5.1)
Phosphorus: 4.6 mg/dL (ref 2.5–4.6)
Sodium: 139 mmol/L (ref 135–145)

## 2018-10-06 LAB — GLUCOSE, CAPILLARY
Glucose-Capillary: 155 mg/dL — ABNORMAL HIGH (ref 70–99)
Glucose-Capillary: 213 mg/dL — ABNORMAL HIGH (ref 70–99)
Glucose-Capillary: 90 mg/dL (ref 70–99)
Glucose-Capillary: 109 mg/dL — ABNORMAL HIGH (ref 70–99)

## 2018-10-06 NOTE — Progress Notes (Signed)
Subjective:  Kidney numbers improving daily - down to 4  liters of UOP.    Objective Vital signs in last 24 hours: Vitals:   10/05/18 0452 10/05/18 1257 10/05/18 1910 10/06/18 0519  BP: 140/81 137/88 (!) 143/81 (!) 140/95  Pulse: 82 79 85 86  Resp: 18 18 18 18   Temp: 98.2 F (36.8 C) 98.1 F (36.7 C) 98.6 F (37 C) 99 F (37.2 C)  TempSrc: Oral Oral Oral Oral  SpO2: 99% 99% 100% 97%  Weight: 86.3 kg   85.5 kg  Height:       Weight change: -0.816 kg  Intake/Output Summary (Last 24 hours) at 10/06/2018 0915 Last data filed at 10/06/2018 0854 Gross per 24 hour  Intake 3870.41 ml  Output 3919 ml  Net -48.59 ml    Assessment/ Plan: Pt is a 63 y.o. yo male with DM, HTN with normal crt in 2017 who was admitted on 09/28/2018 with AKI- biopsy proven AIN Assessment/Plan: 1. Renal- normal creatinine in 2017- now with AKI- on ARB and metformin.  Work up really seems negative- seemed to have 4 grams of protein but only 100 on dip and relatively normal serum albumin- 6-10 WBC.  Some indication of BOO but no hydro on u/s. S/p biopsy consistent with AIN thought maybe due to PPI-   renal function truly recovering- great UOP - post injury diuresis.  Now that polyuria is better, will d/c foley and d/c IVF- if able to maintain volume status and numbers again better tomorrow possible for discharge tomorrow.  I will arrange OP labs and follow up appt with [place].    2. HTN/vol- not overloaded BP is not low-stop IVF 3. Anemia- not an issue 4. Hypokalemia- being repleted daily - last repletion yesterday, now that UOP has slowed hopefully can maintain with just diet- change diet to carb modified only   Shawn Yu    Labs: Basic Metabolic Panel: Recent Labs  Lab 10/04/18 0548 10/05/18 0405 10/06/18 0335  NA 134* 138 139  K 2.9* 3.4* 3.8  CL 85* 97* 106  CO2 29 24 22   GLUCOSE 156* 178* 122*  BUN 133* 109* 78*  CREATININE 11.92* 9.12* 5.78*  CALCIUM 7.8* 8.1* 8.6*  PHOS 9.4* 6.8* 4.6    Liver Function Tests: Recent Labs  Lab 10/04/18 0548 10/05/18 0405 10/06/18 0335  ALBUMIN 3.5 3.4* 3.7   No results for input(s): LIPASE, AMYLASE in the last 168 hours. No results for input(s): AMMONIA in the last 168 hours. CBC: Recent Labs  Lab 09/30/18 0535  WBC 12.0*  HGB 13.3  HCT 39.5  MCV 88.2  PLT 259   Cardiac Enzymes: No results for input(s): CKTOTAL, CKMB, CKMBINDEX, TROPONINI in the last 168 hours. CBG: Recent Labs  Lab 10/05/18 0608 10/05/18 1144 10/05/18 1609 10/05/18 2102 10/06/18 0611  GLUCAP 171* 138* 177* 133* 155*    Iron Studies: No results for input(s): IRON, TIBC, TRANSFERRIN, FERRITIN in the last 72 hours. Studies/Results: No results found. Medications: Infusions: . sodium chloride 100 mL/hr at 10/05/18 1534    Scheduled Medications: . amLODipine  10 mg Oral QPM  . buPROPion  300 mg Oral Daily  . insulin aspart  0-9 Units Subcutaneous TID WC  . polyethylene glycol  17 g Oral Daily  . zolpidem  10 mg Oral QHS    have reviewed scheduled and prn medications.  Physical Exam: General: NAD- up walking  Heart: RRR Lungs: mostly clear Abdomen: soft, non tender Extremities: min edema  10/06/2018,9:15 AM  LOS: 8 days

## 2018-10-06 NOTE — Progress Notes (Addendum)
Patient does not have urine amount documented.  Patient has urinated multiple times but has been urinating in toilet without measuring amount.   Provided education regarding measuring urination amounts.

## 2018-10-06 NOTE — Progress Notes (Signed)
[facility]  PROGRESS NOTE  Shawn Yu HBZ:[id number] DOB: 11-25-1955 DOA: 09/28/2018 PCP: Wenda Low, MD   Brief HPI:   63 year old male with a history of diabetes mellitus type 2, hypertension, dyslipidemia presented to the ED with worsening renal function.  Patient had nausea vomiting 3 to 4 days prior to hospitalization along with vague abdominal discomfort in the left upper quadrant.  Denies diarrhea.  In the ED patient was found to have creatinine of 6.8 with BUN 73.    Subjective   Patient denies abdominal pain, making urine   Assessment/Plan:     1. Acute kidney injury-slowly improving, today creatinine is down to 5.78.  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  Renal function is recovering.  Nephrology recommending to discharge patient in a.m. if kidney function continues to improve.  2. Hypertension-continue amlodipine.  3. Type 2 diabetes mellitus-continue sliding scale insulin with NovoLog.  4. Depression/insomnia-continue bupropion, Ambien.  5. Diarrhea-resolved, continue Lomotil as needed     CBG: Recent Labs  Lab 10/05/18 1144 10/05/18 1609 10/05/18 2102 10/06/18 0611 10/06/18 1124  GLUCAP 138* 177* 133* 155* 213*    CBC: Recent Labs  Lab 09/30/18 0535  WBC 12.0*  HGB 13.3  HCT 39.5  MCV 88.2  PLT 101    Basic Metabolic Panel: Recent Labs  Lab 10/02/18 0413 10/03/18 0453 10/04/18 0548 10/05/18 0405 10/06/18 0335  NA 131* 134* 134* 138 139  K 4.3 3.7 2.9* 3.4* 3.8  CL 90* 89* 85* 97* 106  CO2 17* 20* 29 24 22   GLUCOSE 157* 136* 156* 178* 122*  BUN 142* 143* 133* 109* 78*  CREATININE 14.87* 14.37* 11.92* 9.12* 5.78*  CALCIUM 8.0* 8.0* 7.8* 8.1* 8.6*  PHOS >30.1* 11.8* 9.4* 6.8* 4.6     DVT prophylaxis: SCDs  Code Status: Full code  Family Communication: No family at bedside  Disposition Plan: likely home in  am     Consultants:  Nephrology  Procedures:  None   Antibiotics:   Anti-infectives (From admission, onward)   None       Objective   Vitals:   10/05/18 1910 10/06/18 0519 10/06/18 0914 10/06/18 1316  BP: (!) 143/81 (!) 140/95 136/83 (!) 143/88  Pulse: 85 86 78 68  Resp: 18 18 17 18   Temp: 98.6 F (37 C) 99 F (37.2 C) 99.4 F (37.4 C) 99.6 F (37.6 C)  TempSrc: Oral Oral Oral Oral  SpO2: 100% 97% 97% 100%  Weight:  85.5 kg    Height:        Intake/Output Summary (Last 24 hours) at 10/06/2018 1703 Last data filed at 10/06/2018 1127 Gross per 24 hour  Intake 3376.66 ml  Output 2044 ml  Net 1332.66 ml   Filed Weights   10/04/18 0037 10/05/18 0452 10/06/18 0519  Weight: 87.3 kg 86.3 kg 85.5 kg     Physical Examination:    General: Appears in no acute distress  Cardiovascular: S1-S2, regular, no murmur auscultated  Respiratory: Clear to auscultation bilaterally  Abdomen: Abdomen is soft, nontender, no organomegaly  Extremities: No edema of the lower extremities  Neurologic: Alert, oriented x3, no focal deficit noted     Data Reviewed: I have personally reviewed following labs and imaging studies    Liver Function Tests: Recent Labs  Lab 10/02/18 0413 10/03/18 0453 10/04/18 0548 10/05/18 0405 10/06/18 0335  ALBUMIN 3.4* 3.4* 3.5 3.4* 3.7   No results for input(s): LIPASE, AMYLASE in the last 168  hours. No results for input(s): AMMONIA in the last 168 hours.  Cardiac Enzymes: No results for input(s): CKTOTAL, CKMB, CKMBINDEX, TROPONINI in the last 168 hours. BNP (last 3 results) Recent Labs    09/28/18 1742  BNP 96.8    ProBNP (last 3 results) No results for input(s): PROBNP in the last 8760 hours.    Studies: No results found.  Scheduled Meds: . amLODipine  10 mg Oral QPM  . buPROPion  300 mg Oral Daily  . insulin aspart  0-9 Units Subcutaneous TID WC  . polyethylene glycol  17 g Oral Daily  . zolpidem  10 mg Oral  QHS    Admission status: Inpatient: Based on patients clinical presentation and evaluation of above clinical data, I have made determination that patient meets Inpatient criteria at this time.  Time spent: 30 min  [facility] Pager [phone]. If 7PM-7AM, please contact night-coverage at [email or web address], Office  [phone]  password [email or web address]  10/06/2018, 5:03 PM  LOS: 8 days

## 2018-10-06 NOTE — Plan of Care (Signed)

## 2018-10-06 NOTE — Progress Notes (Signed)
To the best of my knowledge, documentation by Janey Greaser nursing student is complete and accurate.

## 2018-10-07 LAB — RENAL FUNCTION PANEL
Albumin: 3.5 g/dL (ref 3.5–5.0)
Anion gap: 11 (ref 5–15)
BUN: 49 mg/dL — ABNORMAL HIGH (ref 8–23)
CALCIUM: 8.8 mg/dL — AB (ref 8.9–10.3)
CO2: 21 mmol/L — ABNORMAL LOW (ref 22–32)
Chloride: 106 mmol/L (ref 98–111)
Creatinine, Ser: 3.03 mg/dL — ABNORMAL HIGH (ref 0.61–1.24)
GFR calc Af Amer: 24 mL/min — ABNORMAL LOW (ref >60)
GFR calc non Af Amer: 21 mL/min — ABNORMAL LOW (ref >60)
Glucose, Bld: 129 mg/dL — ABNORMAL HIGH (ref 70–99)
Phosphorus: 3.8 mg/dL (ref 2.5–4.6)
Potassium: 3.4 mmol/L — ABNORMAL LOW (ref 3.5–5.1)
SODIUM: 138 mmol/L (ref 135–145)

## 2018-10-07 LAB — GLUCOSE, CAPILLARY
Glucose-Capillary: 134 mg/dL — ABNORMAL HIGH (ref 70–99)
Glucose-Capillary: 149 mg/dL — ABNORMAL HIGH (ref 70–99)

## 2018-10-07 MED ORDER — POTASSIUM CHLORIDE CRYS ER 20 MEQ PO TBCR
20.0000 meq | EXTENDED_RELEASE_TABLET | Freq: Once | ORAL | Status: AC
  Administered 2018-10-07: 20 meq via ORAL
  Filled 2018-10-07: qty 1

## 2018-10-07 NOTE — Progress Notes (Signed)
Subjective:  Kidney numbers improving daily -  UOP really normal.    Objective Vital signs in last 24 hours: Vitals:   10/06/18 0914 10/06/18 1316 10/06/18 2001 10/07/18 0401  BP: 136/83 (!) 143/88 127/85 (!) 143/80  Pulse: 78 68 80 85  Resp: 17 18 18 18   Temp: 99.4 F (37.4 C) 99.6 F (37.6 C) 98.8 F (37.1 C) 98.5 F (36.9 C)  TempSrc: Oral Oral Oral Oral  SpO2: 97% 100% 98% 96%  Weight:    84.3 kg  Height:       Weight change: -1.134 kg  Intake/Output Summary (Last 24 hours) at 10/07/2018 0820 Last data filed at 10/07/2018 8366 Gross per 24 hour  Intake 3260.66 ml  Output 1850 ml  Net 1410.66 ml    Assessment/ Plan: Pt is a 63 y.o. yo male with DM, HTN with normal crt in 2017 who was admitted on 09/28/2018 with AKI- biopsy proven AIN Assessment/Plan: 1. Renal- normal creatinine in 2017- now with AKI- on ARB and metformin.  Work up really seems negative-  4 grams ? of protein but only 100 on dip and relatively normal serum albumin- 6-10 WBC.  Some indication of BOO but no hydro on u/s. S/p biopsy consistent with AIN thought maybe due to PPI-   renal function truly recovering- great UOP - post injury diuresis. From my standpoint, OK for discharge today.  I will arrange OP labs and follow up appt with [place].    2. HTN/vol- not overloaded BP is not low-stopped IVF 3. Anemia- not an issue 4. Hypokalemia- being repleted daily - last repletion 3/17, now that UOP has slowed hopefully can maintain with just diet- change diet to carb modified only- no K restriction    Louis Meckel    Labs: Basic Metabolic Panel: Recent Labs  Lab 10/05/18 0405 10/06/18 0335 10/07/18 0415  NA 138 139 138  K 3.4* 3.8 3.4*  CL 97* 106 106  CO2 24 22 21*  GLUCOSE 178* 122* 129*  BUN 109* 78* 49*  CREATININE 9.12* 5.78* 3.03*  CALCIUM 8.1* 8.6* 8.8*  PHOS 6.8* 4.6 3.8   Liver Function Tests: Recent Labs  Lab 10/05/18 0405 10/06/18 0335 10/07/18 0415  ALBUMIN 3.4* 3.7 3.5   No  results for input(s): LIPASE, AMYLASE in the last 168 hours. No results for input(s): AMMONIA in the last 168 hours. CBC: No results for input(s): WBC, NEUTROABS, HGB, HCT, MCV, PLT in the last 168 hours. Cardiac Enzymes: No results for input(s): CKTOTAL, CKMB, CKMBINDEX, TROPONINI in the last 168 hours. CBG: Recent Labs  Lab 10/06/18 0611 10/06/18 1124 10/06/18 1724 10/06/18 2132 10/07/18 0608  GLUCAP 155* 213* 90 109* 134*    Iron Studies: No results for input(s): IRON, TIBC, TRANSFERRIN, FERRITIN in the last 72 hours. Studies/Results: No results found. Medications: Infusions:   Scheduled Medications: . amLODipine  10 mg Oral QPM  . buPROPion  300 mg Oral Daily  . insulin aspart  0-9 Units Subcutaneous TID WC  . polyethylene glycol  17 g Oral Daily  . zolpidem  10 mg Oral QHS    have reviewed scheduled and prn medications.  Physical Exam: General: NAD- up walking  Heart: RRR Lungs: mostly clear Abdomen: soft, non tender Extremities: min edema    10/07/2018,8:20 AM  LOS: 9 days

## 2018-10-07 NOTE — Discharge Summary (Signed)
Physician Discharge Summary  Shawn Yu BSJ:[id number] DOB: 08/25/1955 DOA: 09/28/2018  PCP: Wenda Low, MD  Admit date: 09/28/2018 Discharge date: 10/07/2018  Time spent: 40* minutes  Recommendations for Outpatient Follow-up:  1. Follow-up nephrology in 2 weeks   Discharge Diagnoses:  Principal Problem:   ARF (acute renal failure) ([place]) Active Problems:   Essential hypertension   DM2 (diabetes mellitus, type 2) ([place])   Discharge Condition: Stable  Diet recommendation: Low-salt diet  Filed Weights   10/05/18 0452 10/06/18 0519 10/07/18 0401  Weight: 86.3 kg 85.5 kg 84.3 kg    History of present illness:  63 year old male with a history of diabetes mellitus type 2, hypertension, dyslipidemia presented to the ED with worsening renal function.  Patient had nausea vomiting 3 to 4 days prior to hospitalization along with vague abdominal discomfort in the left upper quadrant.  Denies diarrhea.  In the ED patient was found to have creatinine of 6.8 with BUN 73.  Hospital Course:   1. Acute kidney injury-significant improvement, today creatinine is down to 3.03.  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  Renal function is recovering.  Nephrology recommending to discharge patient.  They will follow patient as outpatient.  2. Hypertension-blood pressure stable, currently off antihypertensive medications.  Nephrology to determine restarting antihypertensive medications at the time of follow-up.  Discontinue losartan.  3. Type 2 diabetes mellitus-continue Jardiance, will discontinue metformin.  4. Depression/insomnia-continue bupropion, BuSpar  5. Diarrhea-resolved  6. Hypokalemia-potassium was 3.4, will replace potassium before discharge.   Procedures:  Renal biopsy  Consultations:  Nephrology  Discharge Exam: Vitals:   10/07/18 0401 10/07/18 0924  BP: (!) 143/80 136/81  Pulse: 85 80  Resp: 18 18  Temp: 98.5 F (36.9  C) 98.8 F (37.1 C)  SpO2: 96% 96%    General: No acute distress Cardiovascular: 2, regular, no murmur auscultated Respiratory: Clear to auscultation bilaterally  Discharge Instructions   Discharge Instructions    Diet - low sodium heart healthy   Complete by:  As directed    Increase activity slowly   Complete by:  As directed      Allergies as of 10/07/2018   No Known Allergies     Medication List    STOP taking these medications   amLODipine 10 MG tablet Commonly known as:  NORVASC   estazolam 2 MG tablet Commonly known as:  PROSOM   losartan 50 MG tablet Commonly known as:  COZAAR   meloxicam 15 MG tablet Commonly known as:  MOBIC   metFORMIN 1000 MG tablet Commonly known as:  GLUCOPHAGE   omeprazole 20 MG capsule Commonly known as:  PRILOSEC     TAKE these medications   atorvastatin 20 MG tablet Commonly known as:  LIPITOR Take 20 mg by mouth every evening.   Belsomra 15 MG Tabs Generic drug:  Suvorexant Take 15 mg by mouth at bedtime as needed for sleep.   buPROPion 300 MG 24 hr tablet Commonly known as:  WELLBUTRIN XL Take 300 mg by mouth daily.   busPIRone 15 MG tablet Commonly known as:  BUSPAR Take 1 tablet by mouth 3 (three) times daily.   Jardiance 25 MG Tabs tablet Generic drug:  empagliflozin Take 25 mg by mouth daily.   methocarbamol 500 MG tablet Commonly known as:  ROBAXIN Take 500 mg by mouth 2 (two) times daily as needed. Back pain   mirtazapine 15 MG tablet Commonly known as:  REMERON Take 15 mg  by mouth at bedtime.   ondansetron 4 MG tablet Commonly known as:  ZOFRAN Take 4 mg by mouth 4 (four) times daily as needed for nausea or vomiting.   ONE TOUCH ULTRA TEST test strip Generic drug:  glucose blood CHECK BLOOD SUGAR 3 TIMES A DAY.   QUEtiapine 100 MG tablet Commonly known as:  SEROQUEL Take 100 mg by mouth at bedtime.   tamsulosin 0.4 MG Caps capsule Commonly known as:  FLOMAX Take 0.4 mg by mouth daily as  needed. ATN      No Known Allergies    The results of significant diagnostics from this hospitalization (including imaging, microbiology, ancillary and laboratory) are listed below for reference.    Significant Diagnostic Studies: Dg Chest 2 View  Result Date: 09/28/2018 CLINICAL DATA:  Chest pain EXAM: CHEST - 2 VIEW COMPARISON:  04/28/2016 FINDINGS: Small right pleural effusion with basilar atelectasis. Lungs are otherwise clear. Normal cardiomediastinal contours. IMPRESSION: Small right pleural effusion with associated atelectasis. Electronically Signed   By: Ulyses Jarred M.D.   On: 09/28/2018 18:54   US Renal  Result Date: 09/28/2018 CLINICAL DATA:  Acute renal insufficiency. EXAM: RENAL / URINARY TRACT ULTRASOUND COMPLETE COMPARISON:  None. FINDINGS: Right Kidney: Renal measurements: 12.7 by 5.7 by 6.1 = volume: 231 mL . Echogenicity within normal limits. No mass or hydronephrosis visualized. Left Kidney: Renal measurements: 12.8 by 6.1 by 6.4 cm = volume: 268 mL. Echogenicity within normal limits. No mass or hydronephrosis visualized. Bladder: Not evaluated, decompressed around a Foley catheter. IMPRESSION: Normal appearance of the kidneys. Electronically Signed   By: Fidela Salisbury M.D.   On: 09/28/2018 21:25   US Biopsy (kidney)  Result Date: 09/30/2018 INDICATION: 63 year old male with a history renal failure EXAM: ULTRASOUND-GUIDED KIDNEY BIOPSY MEDICATIONS: None. ANESTHESIA/SEDATION: Moderate (conscious) sedation was employed during this procedure. A total of Versed 1.0 mg and Fentanyl 50 mcg was administered intravenously. Moderate Sedation Time: 11 minutes. The patient's level of consciousness and vital signs were monitored continuously by radiology nursing throughout the procedure under my direct supervision. FLUOROSCOPY TIME:  None COMPLICATIONS: None PROCEDURE: Informed written consent was obtained from the patient after a thorough discussion of the procedural risks,  benefits and alternatives. All questions were addressed. Maximal Sterile Barrier Technique was utilized including caps, mask, sterile gowns, sterile gloves, sterile drape, hand hygiene and skin antiseptic. A timeout was performed prior to the initiation of the procedure. Patient was positioned prone position on the gantry table. Images were stored sent to PACs. Once the patient is prepped and draped in the usual sterile fashion, the skin and subcutaneous tissues overlying the left kidney were generously infiltrated 1% lidocaine for local anesthesia. Using ultrasound guidance, a 15 gauge guide needle was advanced into the lower cortex of the left kidney. Once we confirmed location of the needle tip, 2 separate 16 gauge core biopsy were achieved. Two Gel-Foam pledgets were infused with a small amount of saline. The needle was removed. Final images were stored. The patient tolerated the procedure well and remained hemodynamically stable throughout. No complications were encountered and no significant blood loss encountered. IMPRESSION: Status post ultrasound-guided medical renal biopsy of left kidney. Tissue specimen sent to pathology for complete histopathologic analysis. Signed, Dulcy Fanny. Dellia Nims, RPVI [facility] Electronically Signed   By: Corrie Mckusick D.O.   On: 09/30/2018 17:32    Microbiology: No results found for this or any previous visit (from the past 240 hour(s)).  Labs: Basic Metabolic Panel: Recent Labs  Lab 10/03/18 0453 10/04/18 0548 10/05/18 0405 10/06/18 0335 10/07/18 0415  NA 134* 134* 138 139 138  K 3.7 2.9* 3.4* 3.8 3.4*  CL 89* 85* 97* 106 106  CO2 20* 29 24 22  21*  GLUCOSE 136* 156* 178* 122* 129*  BUN 143* 133* 109* 78* 49*  CREATININE 14.37* 11.92* 9.12* 5.78* 3.03*  CALCIUM 8.0* 7.8* 8.1* 8.6* 8.8*  PHOS 11.8* 9.4* 6.8* 4.6 3.8   Liver Function Tests: Recent Labs  Lab 10/03/18 0453 10/04/18 0548  10/05/18 0405 10/06/18 0335 10/07/18 0415  ALBUMIN 3.4* 3.5 3.4* 3.7 3.5    BNP (last 3 results) Recent Labs    09/28/18 1742  BNP 96.8    ProBNP (last 3 results) No results for input(s): PROBNP in the last 8760 hours.  CBG: Recent Labs  Lab 10/06/18 0611 10/06/18 1124 10/06/18 1724 10/06/18 2132 10/07/18 0608  GLUCAP 155* 213* 90 109* 134*     Signed:  Oswald Hillock MD.  [facility] 10/07/2018, 11:03 AM

## 2018-10-07 NOTE — Progress Notes (Signed)
Patient was discharged home. Discharge and medication teaching with teach back given and follow up appointments intructions. Patient denies shortness of breath and chest pain. Peripheral iv removed, clean dry and intact, pressure and dressing applied. Pt wanted to take a shower before living. Patients belongings with patient: Games developer, phone, wallet, clothes and keys. Patient states that he wants to drive himself home. Patient walked out of unit with belongings in hand and discharge packet.

## 2018-10-13 DIAGNOSIS — N179 Acute kidney failure, unspecified: Secondary | ICD-10-CM | POA: Diagnosis not present

## 2018-10-20 DIAGNOSIS — I129 Hypertensive chronic kidney disease with stage 1 through stage 4 chronic kidney disease, or unspecified chronic kidney disease: Secondary | ICD-10-CM | POA: Diagnosis not present

## 2018-10-20 DIAGNOSIS — N179 Acute kidney failure, unspecified: Secondary | ICD-10-CM | POA: Diagnosis not present

## 2018-10-20 DIAGNOSIS — E1129 Type 2 diabetes mellitus with other diabetic kidney complication: Secondary | ICD-10-CM | POA: Diagnosis not present

## 2018-10-21 ENCOUNTER — Encounter (HOSPITAL_COMMUNITY): Payer: Self-pay

## 2018-11-08 DIAGNOSIS — E7800 Pure hypercholesterolemia, unspecified: Secondary | ICD-10-CM | POA: Diagnosis not present

## 2018-11-08 DIAGNOSIS — E1169 Type 2 diabetes mellitus with other specified complication: Secondary | ICD-10-CM | POA: Diagnosis not present

## 2018-11-09 DIAGNOSIS — E1169 Type 2 diabetes mellitus with other specified complication: Secondary | ICD-10-CM | POA: Diagnosis not present

## 2018-11-25 DIAGNOSIS — N179 Acute kidney failure, unspecified: Secondary | ICD-10-CM | POA: Diagnosis not present

## 2019-03-29 ENCOUNTER — Ambulatory Visit
Admission: RE | Admit: 2019-03-29 | Discharge: 2019-03-29 | Disposition: A | Discharge status: Home / Self Care | Payer: 59 | Source: Ambulatory Visit | Attending: Internal Medicine | Admitting: Internal Medicine

## 2019-03-29 ENCOUNTER — Other Ambulatory Visit: Payer: Self-pay | Admitting: Internal Medicine

## 2019-03-29 DIAGNOSIS — M25561 Pain in right knee: Secondary | ICD-10-CM

## 2019-03-29 DIAGNOSIS — M25511 Pain in right shoulder: Secondary | ICD-10-CM

## 2019-08-17 DIAGNOSIS — E1169 Type 2 diabetes mellitus with other specified complication: Secondary | ICD-10-CM | POA: Diagnosis not present

## 2019-08-17 DIAGNOSIS — I10 Essential (primary) hypertension: Secondary | ICD-10-CM | POA: Diagnosis not present

## 2019-08-17 DIAGNOSIS — F419 Anxiety disorder, unspecified: Secondary | ICD-10-CM | POA: Diagnosis not present

## 2019-08-17 DIAGNOSIS — G4700 Insomnia, unspecified: Secondary | ICD-10-CM | POA: Diagnosis not present

## 2019-08-17 DIAGNOSIS — Z135 Encounter for screening for eye and ear disorders: Secondary | ICD-10-CM | POA: Diagnosis not present

## 2020-02-28 DIAGNOSIS — E119 Type 2 diabetes mellitus without complications: Secondary | ICD-10-CM | POA: Diagnosis not present

## 2020-03-01 DIAGNOSIS — I10 Essential (primary) hypertension: Secondary | ICD-10-CM | POA: Diagnosis not present

## 2020-03-07 DIAGNOSIS — E1169 Type 2 diabetes mellitus with other specified complication: Secondary | ICD-10-CM | POA: Diagnosis not present

## 2020-03-07 DIAGNOSIS — E7800 Pure hypercholesterolemia, unspecified: Secondary | ICD-10-CM | POA: Diagnosis not present

## 2020-03-07 DIAGNOSIS — Z125 Encounter for screening for malignant neoplasm of prostate: Secondary | ICD-10-CM | POA: Diagnosis not present

## 2020-03-07 DIAGNOSIS — Z23 Encounter for immunization: Secondary | ICD-10-CM | POA: Diagnosis not present

## 2020-03-07 DIAGNOSIS — G4700 Insomnia, unspecified: Secondary | ICD-10-CM | POA: Diagnosis not present

## 2020-03-07 DIAGNOSIS — Z0000 Encounter for general adult medical examination without abnormal findings: Secondary | ICD-10-CM | POA: Diagnosis not present

## 2020-03-07 DIAGNOSIS — I10 Essential (primary) hypertension: Secondary | ICD-10-CM | POA: Diagnosis not present

## 2020-04-04 DIAGNOSIS — Z23 Encounter for immunization: Secondary | ICD-10-CM | POA: Diagnosis not present

## 2020-04-04 DIAGNOSIS — I10 Essential (primary) hypertension: Secondary | ICD-10-CM | POA: Diagnosis not present

## 2020-04-11 IMAGING — US RENAL/URINARY TRACT ULTRASOUND
1 series · 14 of 25 positions shown · non-contrast
Comparison: None.

CLINICAL DATA: Acute renal insufficiency.

EXAM:
RENAL / URINARY TRACT ULTRASOUND COMPLETE

[Series 1: renal/urinary tract ultrasound · 38 acquisitions, 14 frames shown]
[im 1/38]
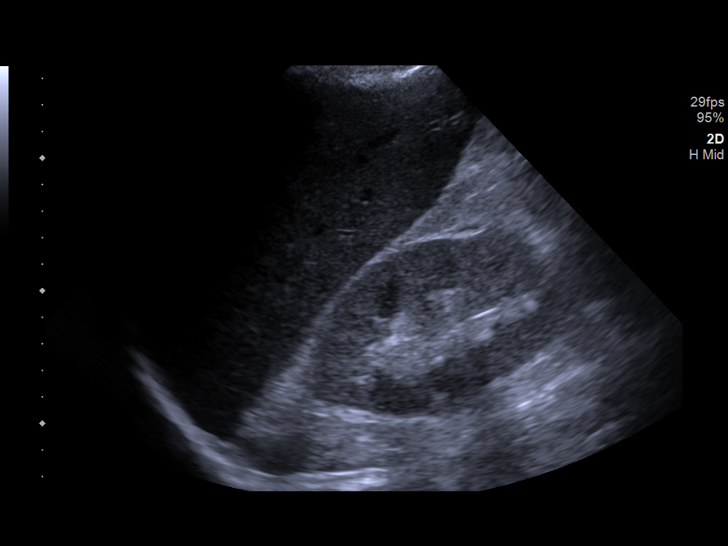
[im 4/38]
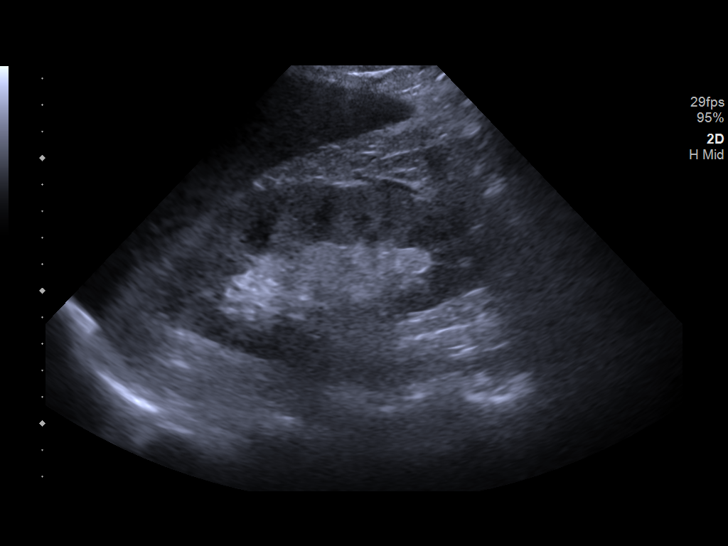
[im 7/38]
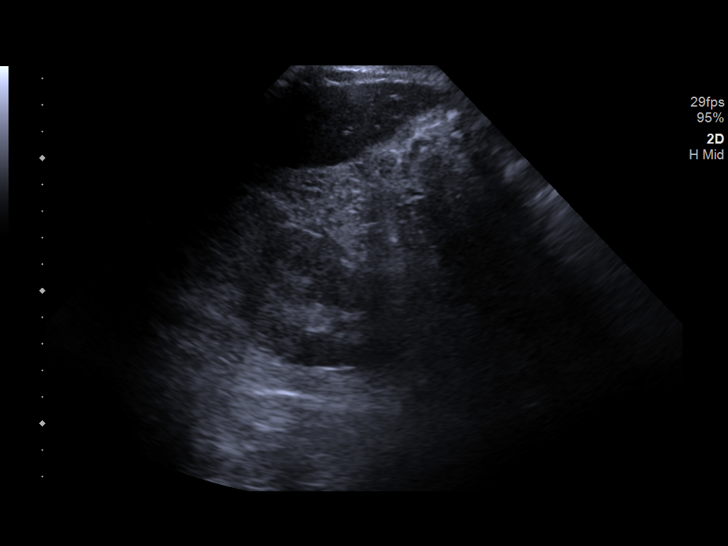
[im 10/38]
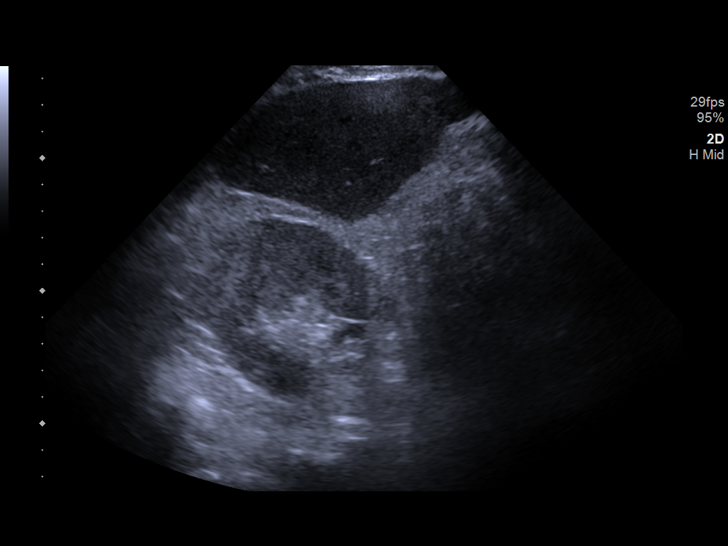
[im 13/38]
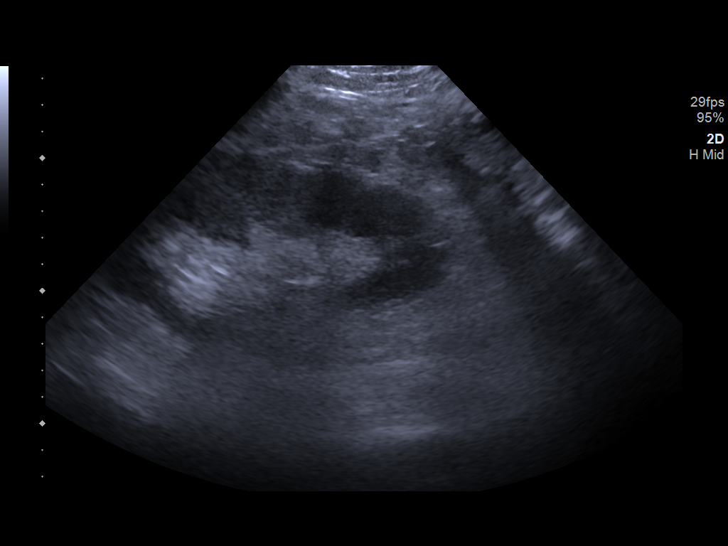
[im 14/38]
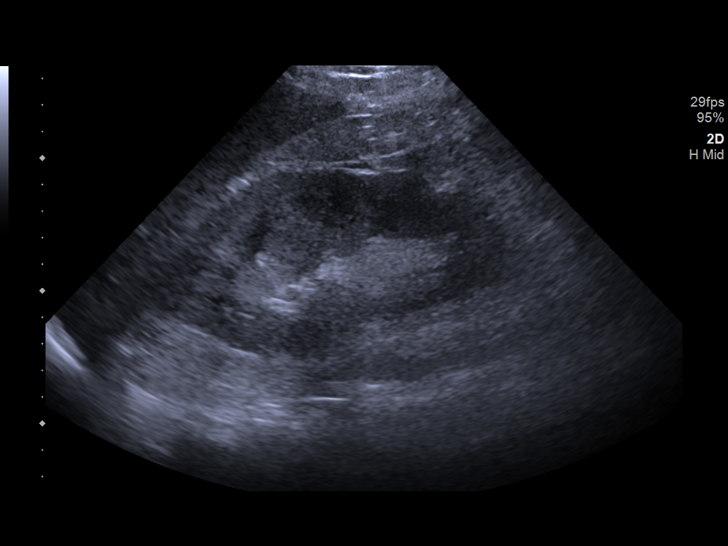
[im 17/38]
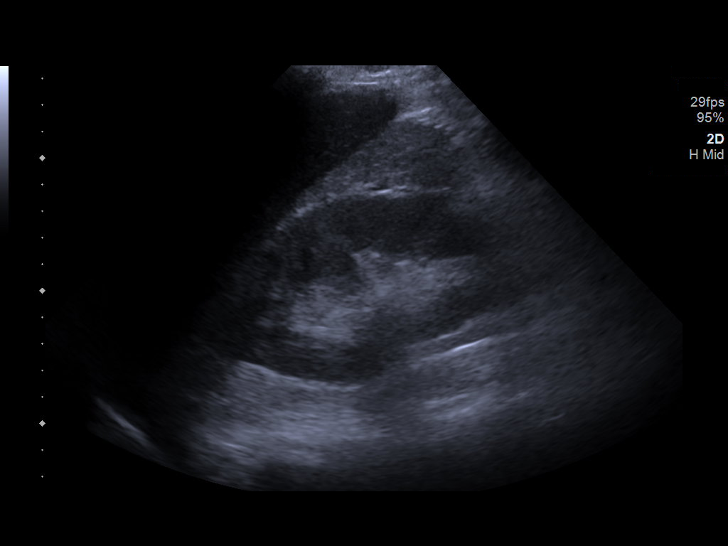
[im 21/38]
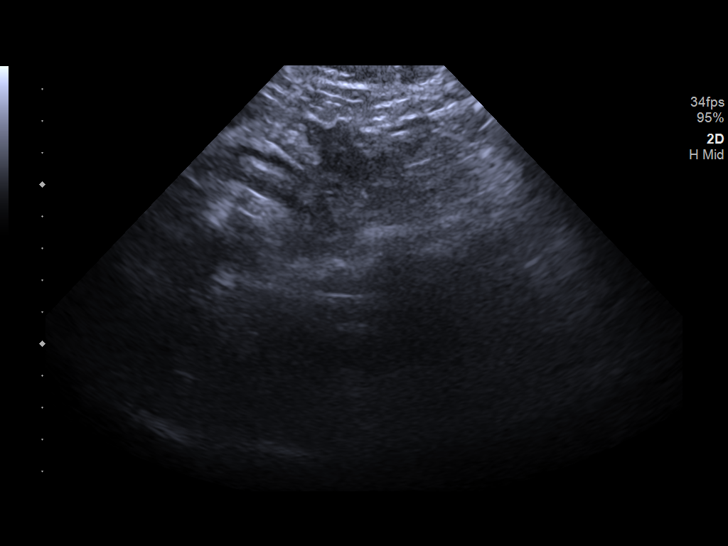
[im 24/38]
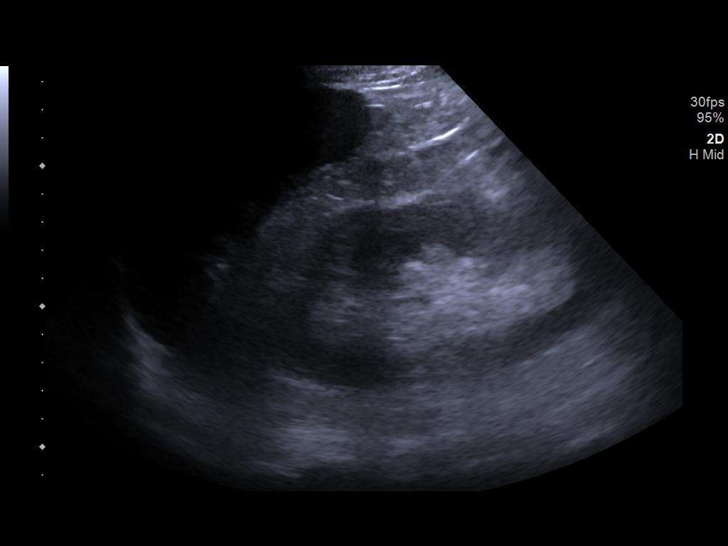
[im 25/38]
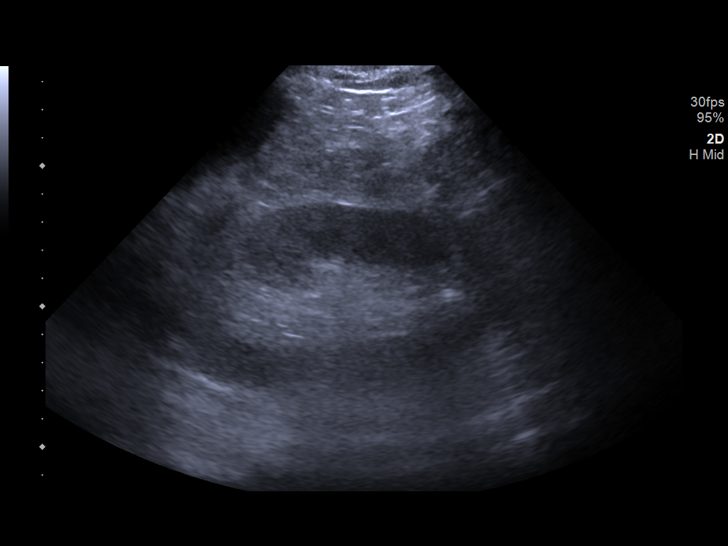
[im 28/38]
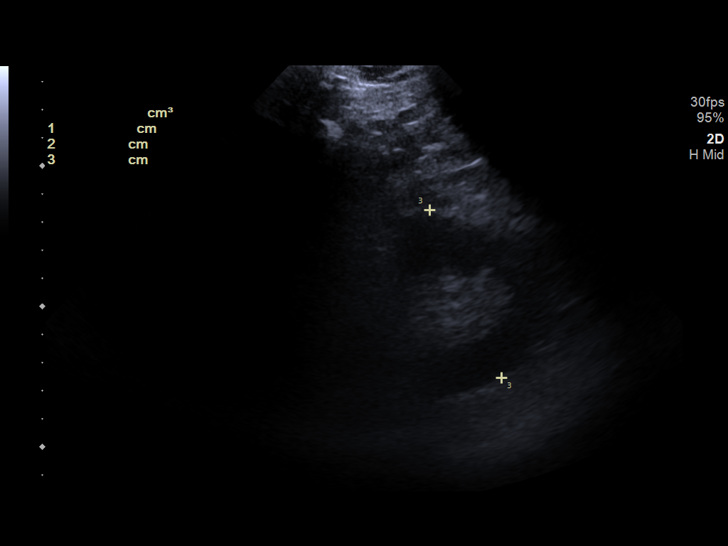
[im 31/38]
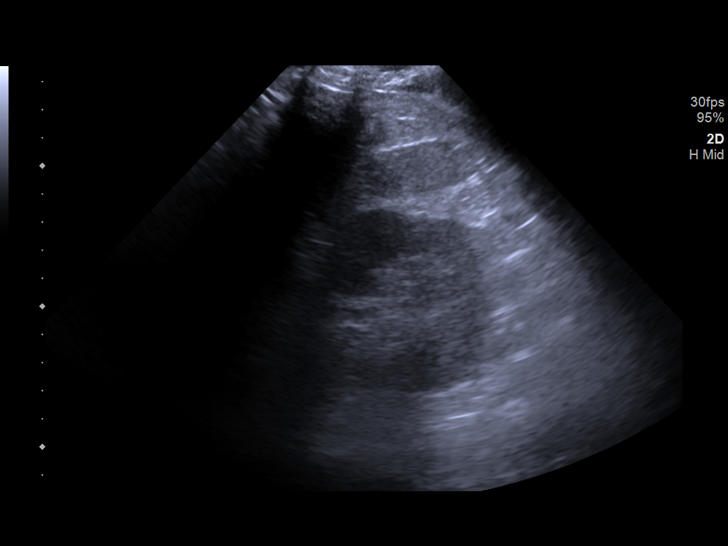
[im 34/38]
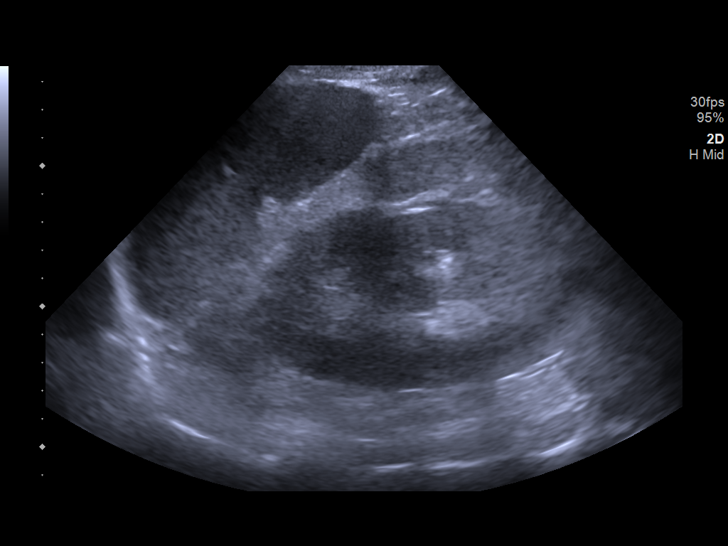
[im 38/38]
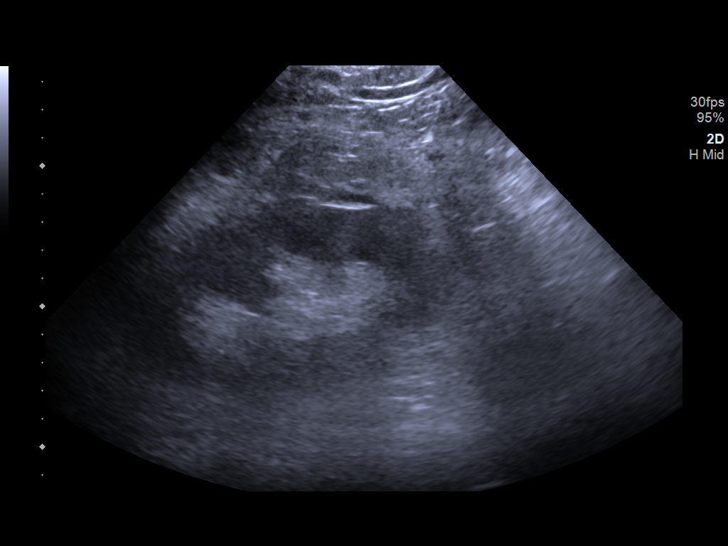

[14 of 25 positions shown; findings below may reference images not displayed]

FINDINGS: Right Kidney:

Renal measurements: 12.7 by 5.7 by 6.1 = volume: 231 mL .
Echogenicity within normal limits. No mass or hydronephrosis
visualized.

Left Kidney:

Renal measurements: 12.8 by 6.1 by 6.4 cm = volume: 268 mL.
Echogenicity within normal limits. No mass or hydronephrosis
visualized.

Bladder:

Not evaluated, decompressed around a Foley catheter.
IMPRESSION: Normal appearance of the kidneys.

## 2020-06-07 DIAGNOSIS — R3915 Urgency of urination: Secondary | ICD-10-CM | POA: Diagnosis not present

## 2020-06-07 DIAGNOSIS — R350 Frequency of micturition: Secondary | ICD-10-CM | POA: Diagnosis not present

## 2020-06-07 DIAGNOSIS — G4700 Insomnia, unspecified: Secondary | ICD-10-CM | POA: Diagnosis not present

## 2020-06-07 DIAGNOSIS — N400 Enlarged prostate without lower urinary tract symptoms: Secondary | ICD-10-CM | POA: Diagnosis not present

## 2020-06-13 DIAGNOSIS — F1020 Alcohol dependence, uncomplicated: Secondary | ICD-10-CM | POA: Diagnosis not present

## 2020-06-13 DIAGNOSIS — R42 Dizziness and giddiness: Secondary | ICD-10-CM | POA: Diagnosis not present

## 2020-06-13 DIAGNOSIS — I10 Essential (primary) hypertension: Secondary | ICD-10-CM | POA: Diagnosis not present

## 2020-06-27 DIAGNOSIS — I10 Essential (primary) hypertension: Secondary | ICD-10-CM | POA: Diagnosis not present

## 2020-07-16 DIAGNOSIS — J069 Acute upper respiratory infection, unspecified: Secondary | ICD-10-CM | POA: Diagnosis not present

## 2020-08-01 DIAGNOSIS — N401 Enlarged prostate with lower urinary tract symptoms: Secondary | ICD-10-CM | POA: Diagnosis not present

## 2020-08-01 DIAGNOSIS — R3915 Urgency of urination: Secondary | ICD-10-CM | POA: Diagnosis not present

## 2020-08-01 DIAGNOSIS — R350 Frequency of micturition: Secondary | ICD-10-CM | POA: Diagnosis not present

## 2020-08-01 DIAGNOSIS — R3912 Poor urinary stream: Secondary | ICD-10-CM | POA: Diagnosis not present

## 2020-08-21 DIAGNOSIS — N319 Neuromuscular dysfunction of bladder, unspecified: Secondary | ICD-10-CM | POA: Diagnosis not present

## 2020-08-21 DIAGNOSIS — U071 COVID-19: Secondary | ICD-10-CM | POA: Diagnosis not present

## 2020-09-06 DIAGNOSIS — E7800 Pure hypercholesterolemia, unspecified: Secondary | ICD-10-CM | POA: Diagnosis not present

## 2020-09-06 DIAGNOSIS — I10 Essential (primary) hypertension: Secondary | ICD-10-CM | POA: Diagnosis not present

## 2020-09-06 DIAGNOSIS — Z23 Encounter for immunization: Secondary | ICD-10-CM | POA: Diagnosis not present

## 2020-09-06 DIAGNOSIS — G4700 Insomnia, unspecified: Secondary | ICD-10-CM | POA: Diagnosis not present

## 2020-09-06 DIAGNOSIS — E1169 Type 2 diabetes mellitus with other specified complication: Secondary | ICD-10-CM | POA: Diagnosis not present

## 2020-09-18 DIAGNOSIS — F1021 Alcohol dependence, in remission: Secondary | ICD-10-CM | POA: Diagnosis not present

## 2020-09-18 DIAGNOSIS — F1121 Opioid dependence, in remission: Secondary | ICD-10-CM | POA: Diagnosis not present

## 2020-09-21 DIAGNOSIS — Z79891 Long term (current) use of opiate analgesic: Secondary | ICD-10-CM | POA: Diagnosis not present

## 2020-09-25 ENCOUNTER — Ambulatory Visit (INDEPENDENT_AMBULATORY_CARE_PROVIDER_SITE_OTHER): Payer: BC Managed Care – PPO

## 2020-09-25 ENCOUNTER — Other Ambulatory Visit: Payer: Self-pay

## 2020-09-25 ENCOUNTER — Ambulatory Visit: Payer: BC Managed Care – PPO | Admitting: Podiatry

## 2020-09-25 DIAGNOSIS — M2041 Other hammer toe(s) (acquired), right foot: Secondary | ICD-10-CM

## 2020-09-25 DIAGNOSIS — M67449 Ganglion, unspecified hand: Secondary | ICD-10-CM

## 2020-09-25 DIAGNOSIS — M67471 Ganglion, right ankle and foot: Secondary | ICD-10-CM

## 2020-09-25 NOTE — Progress Notes (Signed)
°  Subjective:  Patient ID: Shawn Yu, male    DOB: 29-Sep-1955,  MRN: [id number]  Chief Complaint  Patient presents with   Cyst    Pt states there is a cyst- not pain -redness- no soreness-pt states he noticed it 2-3 weeks ago    65 y.o. male presents with the above complaint. History confirmed with patient. Thinks it is present for approximately 4 weeks.  Objective:  Physical Exam: warm, good capillary refill, no trophic changes or ulcerative lesions, normal DP and PT pulses and normal sensory exam.  Right Foot: right 4th toe with PIPJ large fluctuant cyst with gelatinous aspirate   No images are attached to the encounter.  Radiographs: X-ray of the right foot: no fracture, dislocation, swelling or degenerative changes noted and digital contractures Assessment:   1. Hammertoe of right foot   2. Digital mucous cyst    Plan:  Patient was evaluated and treated and all questions answered.  Digital mucoid cyst -XR reviewed with patient -Discussed proceeding with aspiration of the cyst. Patient agrees to proceed. Anesthetized with 1.5cc lidocaine plain, 1.5 cc marcaine 0.5% plain. Prepped with betadine Thick clear gelatinous fluid aspirated. Dressed with band-aid and compression dressing. -Discussed likely recurrence. Will consider excision if symptomatic and recurs.  No follow-ups on file.

## 2020-09-27 ENCOUNTER — Ambulatory Visit: Payer: BC Managed Care – PPO | Admitting: Podiatry

## 2020-10-02 DIAGNOSIS — F331 Major depressive disorder, recurrent, moderate: Secondary | ICD-10-CM | POA: Diagnosis not present

## 2020-10-02 DIAGNOSIS — F1121 Opioid dependence, in remission: Secondary | ICD-10-CM | POA: Diagnosis not present

## 2020-10-02 DIAGNOSIS — F1021 Alcohol dependence, in remission: Secondary | ICD-10-CM | POA: Diagnosis not present

## 2020-10-09 ENCOUNTER — Ambulatory Visit: Payer: BC Managed Care – PPO | Admitting: Podiatry

## 2020-10-09 ENCOUNTER — Other Ambulatory Visit: Payer: Self-pay

## 2020-10-09 DIAGNOSIS — E1129 Type 2 diabetes mellitus with other diabetic kidney complication: Secondary | ICD-10-CM

## 2020-10-09 DIAGNOSIS — M2041 Other hammer toe(s) (acquired), right foot: Secondary | ICD-10-CM

## 2020-10-09 DIAGNOSIS — M67449 Ganglion, unspecified hand: Secondary | ICD-10-CM

## 2020-10-09 NOTE — Progress Notes (Signed)
  Subjective:  Patient ID: Shawn Yu, male    DOB: Sep 16, 1955,  MRN: [id number]  Chief Complaint  Patient presents with  . Cyst     Follow up cyst on 4th toe or right foot. Pt states cyst is still present.     65 y.o. male presents with the above complaint. States the lesion came back a few days after it was drained. States it is bothersome at times.  Objective:  Physical Exam: warm, good capillary refill, no trophic changes or ulcerative lesions, normal DP and PT pulses and normal sensory exam.  Right Foot: right 4th toe with PIPJ large fluctuant cyst with gelatinous aspirate   No images are attached to the encounter.  Radiographs: X-ray of the right foot: no fracture, dislocation, swelling or degenerative changes noted and digital contractures Assessment:   1. Digital mucous cyst   2. Hammertoe of right foot   3. Type 2 diabetes mellitus with other diabetic kidney complication, without long-term current use of insulin ([place])    Plan:  Patient was evaluated and treated and all questions answered.  Digital mucoid cyst -We discussed excision since the lesion has recurred. We discussed r/b/a. Patient would like to think it over. -Patient has failed all conservative therapy and wishes to proceed with surgical intervention. All risks, benefits, and alternatives discussed with patient. No guarantees given. Consent reviewed and signed by patient. -Planned procedures: Right 4th toe excision of cyst, hammertoe correction -ASA 3 - Patient with moderate systemic disease with functional limitations; Risk factors: DM -Post-op anticoagulation: chemoprophylaxis not indicated -DME dispensed for post-op use: none  No follow-ups on file.

## 2020-10-24 DIAGNOSIS — Z1283 Encounter for screening for malignant neoplasm of skin: Secondary | ICD-10-CM | POA: Diagnosis not present

## 2020-10-24 DIAGNOSIS — D225 Melanocytic nevi of trunk: Secondary | ICD-10-CM | POA: Diagnosis not present

## 2020-10-24 DIAGNOSIS — D485 Neoplasm of uncertain behavior of skin: Secondary | ICD-10-CM | POA: Diagnosis not present

## 2020-10-26 DIAGNOSIS — R42 Dizziness and giddiness: Secondary | ICD-10-CM | POA: Diagnosis not present

## 2020-11-01 DIAGNOSIS — F1121 Opioid dependence, in remission: Secondary | ICD-10-CM | POA: Diagnosis not present

## 2020-11-01 DIAGNOSIS — F1021 Alcohol dependence, in remission: Secondary | ICD-10-CM | POA: Diagnosis not present

## 2020-11-07 DIAGNOSIS — R42 Dizziness and giddiness: Secondary | ICD-10-CM | POA: Diagnosis not present

## 2020-11-21 DIAGNOSIS — L821 Other seborrheic keratosis: Secondary | ICD-10-CM | POA: Diagnosis not present

## 2020-11-28 DIAGNOSIS — F1020 Alcohol dependence, uncomplicated: Secondary | ICD-10-CM | POA: Diagnosis not present

## 2020-11-29 DIAGNOSIS — F1020 Alcohol dependence, uncomplicated: Secondary | ICD-10-CM | POA: Diagnosis not present

## 2020-11-30 DIAGNOSIS — F1021 Alcohol dependence, in remission: Secondary | ICD-10-CM | POA: Diagnosis not present

## 2020-11-30 DIAGNOSIS — F1121 Opioid dependence, in remission: Secondary | ICD-10-CM | POA: Diagnosis not present

## 2020-12-04 DIAGNOSIS — F1020 Alcohol dependence, uncomplicated: Secondary | ICD-10-CM | POA: Diagnosis not present

## 2020-12-04 DIAGNOSIS — G4700 Insomnia, unspecified: Secondary | ICD-10-CM | POA: Diagnosis not present

## 2020-12-04 DIAGNOSIS — I10 Essential (primary) hypertension: Secondary | ICD-10-CM | POA: Diagnosis not present

## 2020-12-04 DIAGNOSIS — J309 Allergic rhinitis, unspecified: Secondary | ICD-10-CM | POA: Diagnosis not present

## 2020-12-04 DIAGNOSIS — F331 Major depressive disorder, recurrent, moderate: Secondary | ICD-10-CM | POA: Diagnosis not present

## 2020-12-06 DIAGNOSIS — F1020 Alcohol dependence, uncomplicated: Secondary | ICD-10-CM | POA: Diagnosis not present

## 2020-12-11 DIAGNOSIS — F1020 Alcohol dependence, uncomplicated: Secondary | ICD-10-CM | POA: Diagnosis not present

## 2020-12-13 DIAGNOSIS — F1020 Alcohol dependence, uncomplicated: Secondary | ICD-10-CM | POA: Diagnosis not present

## 2020-12-18 DIAGNOSIS — F1020 Alcohol dependence, uncomplicated: Secondary | ICD-10-CM | POA: Diagnosis not present

## 2020-12-20 DIAGNOSIS — F1020 Alcohol dependence, uncomplicated: Secondary | ICD-10-CM | POA: Diagnosis not present

## 2020-12-25 DIAGNOSIS — F1020 Alcohol dependence, uncomplicated: Secondary | ICD-10-CM | POA: Diagnosis not present

## 2020-12-27 DIAGNOSIS — F1020 Alcohol dependence, uncomplicated: Secondary | ICD-10-CM | POA: Diagnosis not present

## 2021-01-01 DIAGNOSIS — F1020 Alcohol dependence, uncomplicated: Secondary | ICD-10-CM | POA: Diagnosis not present

## 2021-01-03 DIAGNOSIS — F1020 Alcohol dependence, uncomplicated: Secondary | ICD-10-CM | POA: Diagnosis not present

## 2021-01-04 DIAGNOSIS — F1121 Opioid dependence, in remission: Secondary | ICD-10-CM | POA: Diagnosis not present

## 2021-01-04 DIAGNOSIS — F1021 Alcohol dependence, in remission: Secondary | ICD-10-CM | POA: Diagnosis not present

## 2021-02-15 DIAGNOSIS — F1121 Opioid dependence, in remission: Secondary | ICD-10-CM | POA: Diagnosis not present

## 2021-02-15 DIAGNOSIS — F1021 Alcohol dependence, in remission: Secondary | ICD-10-CM | POA: Diagnosis not present

## 2021-05-06 DIAGNOSIS — Z0000 Encounter for general adult medical examination without abnormal findings: Secondary | ICD-10-CM | POA: Diagnosis not present

## 2021-05-06 DIAGNOSIS — Z1389 Encounter for screening for other disorder: Secondary | ICD-10-CM | POA: Diagnosis not present

## 2021-05-06 DIAGNOSIS — G4700 Insomnia, unspecified: Secondary | ICD-10-CM | POA: Diagnosis not present

## 2021-05-06 DIAGNOSIS — E1169 Type 2 diabetes mellitus with other specified complication: Secondary | ICD-10-CM | POA: Diagnosis not present

## 2021-05-06 DIAGNOSIS — Z23 Encounter for immunization: Secondary | ICD-10-CM | POA: Diagnosis not present

## 2021-05-06 DIAGNOSIS — I10 Essential (primary) hypertension: Secondary | ICD-10-CM | POA: Diagnosis not present

## 2021-05-06 DIAGNOSIS — R69 Illness, unspecified: Secondary | ICD-10-CM | POA: Diagnosis not present

## 2021-05-06 DIAGNOSIS — N400 Enlarged prostate without lower urinary tract symptoms: Secondary | ICD-10-CM | POA: Diagnosis not present

## 2021-05-06 DIAGNOSIS — E7800 Pure hypercholesterolemia, unspecified: Secondary | ICD-10-CM | POA: Diagnosis not present

## 2021-05-17 DIAGNOSIS — M545 Low back pain, unspecified: Secondary | ICD-10-CM | POA: Diagnosis not present

## 2021-06-12 DIAGNOSIS — R69 Illness, unspecified: Secondary | ICD-10-CM | POA: Diagnosis not present

## 2021-06-12 DIAGNOSIS — F1021 Alcohol dependence, in remission: Secondary | ICD-10-CM | POA: Diagnosis not present

## 2021-06-12 DIAGNOSIS — F1121 Opioid dependence, in remission: Secondary | ICD-10-CM | POA: Diagnosis not present

## 2021-06-19 DIAGNOSIS — H35033 Hypertensive retinopathy, bilateral: Secondary | ICD-10-CM | POA: Diagnosis not present

## 2021-06-19 DIAGNOSIS — H2510 Age-related nuclear cataract, unspecified eye: Secondary | ICD-10-CM | POA: Diagnosis not present

## 2021-06-19 DIAGNOSIS — H52223 Regular astigmatism, bilateral: Secondary | ICD-10-CM | POA: Diagnosis not present

## 2021-06-19 DIAGNOSIS — H5201 Hypermetropia, right eye: Secondary | ICD-10-CM | POA: Diagnosis not present

## 2021-06-19 DIAGNOSIS — H524 Presbyopia: Secondary | ICD-10-CM | POA: Diagnosis not present

## 2021-06-19 DIAGNOSIS — E11319 Type 2 diabetes mellitus with unspecified diabetic retinopathy without macular edema: Secondary | ICD-10-CM | POA: Diagnosis not present

## 2021-06-28 DIAGNOSIS — F1021 Alcohol dependence, in remission: Secondary | ICD-10-CM | POA: Diagnosis not present

## 2021-06-28 DIAGNOSIS — R69 Illness, unspecified: Secondary | ICD-10-CM | POA: Diagnosis not present

## 2021-06-28 DIAGNOSIS — F1121 Opioid dependence, in remission: Secondary | ICD-10-CM | POA: Diagnosis not present

## 2021-07-10 DIAGNOSIS — F1121 Opioid dependence, in remission: Secondary | ICD-10-CM | POA: Diagnosis not present

## 2021-07-10 DIAGNOSIS — R69 Illness, unspecified: Secondary | ICD-10-CM | POA: Diagnosis not present

## 2021-07-23 DIAGNOSIS — F1121 Opioid dependence, in remission: Secondary | ICD-10-CM | POA: Diagnosis not present

## 2021-07-23 DIAGNOSIS — F1021 Alcohol dependence, in remission: Secondary | ICD-10-CM | POA: Diagnosis not present

## 2021-07-23 DIAGNOSIS — R69 Illness, unspecified: Secondary | ICD-10-CM | POA: Diagnosis not present

## 2021-07-31 DIAGNOSIS — N400 Enlarged prostate without lower urinary tract symptoms: Secondary | ICD-10-CM | POA: Diagnosis not present

## 2021-07-31 DIAGNOSIS — R3915 Urgency of urination: Secondary | ICD-10-CM | POA: Diagnosis not present

## 2021-08-06 DIAGNOSIS — F1021 Alcohol dependence, in remission: Secondary | ICD-10-CM | POA: Diagnosis not present

## 2021-08-06 DIAGNOSIS — F1121 Opioid dependence, in remission: Secondary | ICD-10-CM | POA: Diagnosis not present

## 2021-08-06 DIAGNOSIS — R69 Illness, unspecified: Secondary | ICD-10-CM | POA: Diagnosis not present

## 2021-08-16 DIAGNOSIS — F1121 Opioid dependence, in remission: Secondary | ICD-10-CM | POA: Diagnosis not present

## 2021-08-16 DIAGNOSIS — R69 Illness, unspecified: Secondary | ICD-10-CM | POA: Diagnosis not present

## 2021-08-16 DIAGNOSIS — F1021 Alcohol dependence, in remission: Secondary | ICD-10-CM | POA: Diagnosis not present

## 2021-08-23 DIAGNOSIS — F1021 Alcohol dependence, in remission: Secondary | ICD-10-CM | POA: Diagnosis not present

## 2021-08-23 DIAGNOSIS — R69 Illness, unspecified: Secondary | ICD-10-CM | POA: Diagnosis not present

## 2021-08-23 DIAGNOSIS — F1121 Opioid dependence, in remission: Secondary | ICD-10-CM | POA: Diagnosis not present

## 2021-08-29 DIAGNOSIS — Z86010 Personal history of colonic polyps: Secondary | ICD-10-CM | POA: Diagnosis not present

## 2021-08-29 DIAGNOSIS — D123 Benign neoplasm of transverse colon: Secondary | ICD-10-CM | POA: Diagnosis not present

## 2021-08-29 DIAGNOSIS — D122 Benign neoplasm of ascending colon: Secondary | ICD-10-CM | POA: Diagnosis not present

## 2021-08-29 DIAGNOSIS — K5730 Diverticulosis of large intestine without perforation or abscess without bleeding: Secondary | ICD-10-CM | POA: Diagnosis not present

## 2021-09-04 DIAGNOSIS — D122 Benign neoplasm of ascending colon: Secondary | ICD-10-CM | POA: Diagnosis not present

## 2021-09-04 DIAGNOSIS — D123 Benign neoplasm of transverse colon: Secondary | ICD-10-CM | POA: Diagnosis not present

## 2021-09-13 DIAGNOSIS — L820 Inflamed seborrheic keratosis: Secondary | ICD-10-CM | POA: Diagnosis not present

## 2021-09-16 DIAGNOSIS — F1121 Opioid dependence, in remission: Secondary | ICD-10-CM | POA: Diagnosis not present

## 2021-09-16 DIAGNOSIS — F1021 Alcohol dependence, in remission: Secondary | ICD-10-CM | POA: Diagnosis not present

## 2021-09-16 DIAGNOSIS — R69 Illness, unspecified: Secondary | ICD-10-CM | POA: Diagnosis not present

## 2021-09-18 DIAGNOSIS — R350 Frequency of micturition: Secondary | ICD-10-CM | POA: Diagnosis not present

## 2021-09-18 DIAGNOSIS — F1121 Opioid dependence, in remission: Secondary | ICD-10-CM | POA: Diagnosis not present

## 2021-09-18 DIAGNOSIS — R3914 Feeling of incomplete bladder emptying: Secondary | ICD-10-CM | POA: Diagnosis not present

## 2021-09-18 DIAGNOSIS — R69 Illness, unspecified: Secondary | ICD-10-CM | POA: Diagnosis not present

## 2021-09-18 DIAGNOSIS — N401 Enlarged prostate with lower urinary tract symptoms: Secondary | ICD-10-CM | POA: Diagnosis not present

## 2021-09-18 DIAGNOSIS — F1021 Alcohol dependence, in remission: Secondary | ICD-10-CM | POA: Diagnosis not present

## 2021-09-18 DIAGNOSIS — R351 Nocturia: Secondary | ICD-10-CM | POA: Diagnosis not present

## 2021-09-18 DIAGNOSIS — R3915 Urgency of urination: Secondary | ICD-10-CM | POA: Diagnosis not present

## 2021-09-25 DIAGNOSIS — R69 Illness, unspecified: Secondary | ICD-10-CM | POA: Diagnosis not present

## 2021-09-25 DIAGNOSIS — F1021 Alcohol dependence, in remission: Secondary | ICD-10-CM | POA: Diagnosis not present

## 2021-09-25 DIAGNOSIS — F1121 Opioid dependence, in remission: Secondary | ICD-10-CM | POA: Diagnosis not present

## 2021-10-07 DIAGNOSIS — R3914 Feeling of incomplete bladder emptying: Secondary | ICD-10-CM | POA: Diagnosis not present

## 2021-10-07 DIAGNOSIS — R69 Illness, unspecified: Secondary | ICD-10-CM | POA: Diagnosis not present

## 2021-10-07 DIAGNOSIS — N401 Enlarged prostate with lower urinary tract symptoms: Secondary | ICD-10-CM | POA: Diagnosis not present

## 2021-10-07 DIAGNOSIS — R350 Frequency of micturition: Secondary | ICD-10-CM | POA: Diagnosis not present

## 2021-10-07 DIAGNOSIS — F1121 Opioid dependence, in remission: Secondary | ICD-10-CM | POA: Diagnosis not present

## 2021-10-07 DIAGNOSIS — F1021 Alcohol dependence, in remission: Secondary | ICD-10-CM | POA: Diagnosis not present

## 2021-10-08 DIAGNOSIS — R41840 Attention and concentration deficit: Secondary | ICD-10-CM | POA: Diagnosis not present

## 2021-10-08 DIAGNOSIS — F1021 Alcohol dependence, in remission: Secondary | ICD-10-CM | POA: Diagnosis not present

## 2021-10-08 DIAGNOSIS — R69 Illness, unspecified: Secondary | ICD-10-CM | POA: Diagnosis not present

## 2021-10-08 DIAGNOSIS — F1121 Opioid dependence, in remission: Secondary | ICD-10-CM | POA: Diagnosis not present

## 2021-10-11 DIAGNOSIS — L820 Inflamed seborrheic keratosis: Secondary | ICD-10-CM | POA: Diagnosis not present

## 2021-10-11 DIAGNOSIS — D485 Neoplasm of uncertain behavior of skin: Secondary | ICD-10-CM | POA: Diagnosis not present

## 2021-10-11 DIAGNOSIS — D225 Melanocytic nevi of trunk: Secondary | ICD-10-CM | POA: Diagnosis not present

## 2021-10-14 DIAGNOSIS — F900 Attention-deficit hyperactivity disorder, predominantly inattentive type: Secondary | ICD-10-CM | POA: Diagnosis not present

## 2021-10-14 DIAGNOSIS — F1021 Alcohol dependence, in remission: Secondary | ICD-10-CM | POA: Diagnosis not present

## 2021-10-14 DIAGNOSIS — R69 Illness, unspecified: Secondary | ICD-10-CM | POA: Diagnosis not present

## 2021-10-14 DIAGNOSIS — F1121 Opioid dependence, in remission: Secondary | ICD-10-CM | POA: Diagnosis not present

## 2021-10-16 DIAGNOSIS — R3915 Urgency of urination: Secondary | ICD-10-CM | POA: Diagnosis not present

## 2021-10-16 DIAGNOSIS — N401 Enlarged prostate with lower urinary tract symptoms: Secondary | ICD-10-CM | POA: Diagnosis not present

## 2021-10-30 DIAGNOSIS — R42 Dizziness and giddiness: Secondary | ICD-10-CM | POA: Diagnosis not present

## 2021-10-30 DIAGNOSIS — E7800 Pure hypercholesterolemia, unspecified: Secondary | ICD-10-CM | POA: Diagnosis not present

## 2021-10-30 DIAGNOSIS — E1169 Type 2 diabetes mellitus with other specified complication: Secondary | ICD-10-CM | POA: Diagnosis not present

## 2021-10-30 DIAGNOSIS — R69 Illness, unspecified: Secondary | ICD-10-CM | POA: Diagnosis not present

## 2021-10-30 DIAGNOSIS — I10 Essential (primary) hypertension: Secondary | ICD-10-CM | POA: Diagnosis not present

## 2021-10-30 DIAGNOSIS — G4700 Insomnia, unspecified: Secondary | ICD-10-CM | POA: Diagnosis not present

## 2021-11-01 DIAGNOSIS — D485 Neoplasm of uncertain behavior of skin: Secondary | ICD-10-CM | POA: Diagnosis not present

## 2021-11-06 DIAGNOSIS — F1021 Alcohol dependence, in remission: Secondary | ICD-10-CM | POA: Diagnosis not present

## 2021-11-06 DIAGNOSIS — F900 Attention-deficit hyperactivity disorder, predominantly inattentive type: Secondary | ICD-10-CM | POA: Diagnosis not present

## 2021-11-06 DIAGNOSIS — R69 Illness, unspecified: Secondary | ICD-10-CM | POA: Diagnosis not present

## 2021-11-06 DIAGNOSIS — F1121 Opioid dependence, in remission: Secondary | ICD-10-CM | POA: Diagnosis not present

## 2021-11-07 DIAGNOSIS — F1021 Alcohol dependence, in remission: Secondary | ICD-10-CM | POA: Diagnosis not present

## 2021-11-07 DIAGNOSIS — F1121 Opioid dependence, in remission: Secondary | ICD-10-CM | POA: Diagnosis not present

## 2021-11-07 DIAGNOSIS — F900 Attention-deficit hyperactivity disorder, predominantly inattentive type: Secondary | ICD-10-CM | POA: Diagnosis not present

## 2021-11-07 DIAGNOSIS — R69 Illness, unspecified: Secondary | ICD-10-CM | POA: Diagnosis not present

## 2021-11-12 DIAGNOSIS — N311 Reflex neuropathic bladder, not elsewhere classified: Secondary | ICD-10-CM | POA: Diagnosis not present

## 2021-11-12 DIAGNOSIS — R3915 Urgency of urination: Secondary | ICD-10-CM | POA: Diagnosis not present

## 2021-11-12 DIAGNOSIS — F1121 Opioid dependence, in remission: Secondary | ICD-10-CM | POA: Diagnosis not present

## 2021-11-12 DIAGNOSIS — R69 Illness, unspecified: Secondary | ICD-10-CM | POA: Diagnosis not present

## 2021-11-12 DIAGNOSIS — R3914 Feeling of incomplete bladder emptying: Secondary | ICD-10-CM | POA: Diagnosis not present

## 2021-11-12 DIAGNOSIS — F1021 Alcohol dependence, in remission: Secondary | ICD-10-CM | POA: Diagnosis not present

## 2021-11-12 DIAGNOSIS — N401 Enlarged prostate with lower urinary tract symptoms: Secondary | ICD-10-CM | POA: Diagnosis not present

## 2021-11-12 DIAGNOSIS — F900 Attention-deficit hyperactivity disorder, predominantly inattentive type: Secondary | ICD-10-CM | POA: Diagnosis not present

## 2021-11-12 DIAGNOSIS — N486 Induration penis plastica: Secondary | ICD-10-CM | POA: Diagnosis not present

## 2021-11-12 DIAGNOSIS — L820 Inflamed seborrheic keratosis: Secondary | ICD-10-CM | POA: Diagnosis not present

## 2021-11-22 DIAGNOSIS — R69 Illness, unspecified: Secondary | ICD-10-CM | POA: Diagnosis not present

## 2021-11-22 DIAGNOSIS — F1121 Opioid dependence, in remission: Secondary | ICD-10-CM | POA: Diagnosis not present

## 2021-11-22 DIAGNOSIS — F1021 Alcohol dependence, in remission: Secondary | ICD-10-CM | POA: Diagnosis not present

## 2021-11-22 DIAGNOSIS — F900 Attention-deficit hyperactivity disorder, predominantly inattentive type: Secondary | ICD-10-CM | POA: Diagnosis not present

## 2021-11-29 DIAGNOSIS — R69 Illness, unspecified: Secondary | ICD-10-CM | POA: Diagnosis not present

## 2021-11-29 DIAGNOSIS — F1121 Opioid dependence, in remission: Secondary | ICD-10-CM | POA: Diagnosis not present

## 2021-11-29 DIAGNOSIS — F900 Attention-deficit hyperactivity disorder, predominantly inattentive type: Secondary | ICD-10-CM | POA: Diagnosis not present

## 2021-11-29 DIAGNOSIS — F1021 Alcohol dependence, in remission: Secondary | ICD-10-CM | POA: Diagnosis not present

## 2021-12-04 DIAGNOSIS — F900 Attention-deficit hyperactivity disorder, predominantly inattentive type: Secondary | ICD-10-CM | POA: Diagnosis not present

## 2021-12-04 DIAGNOSIS — F1121 Opioid dependence, in remission: Secondary | ICD-10-CM | POA: Diagnosis not present

## 2021-12-04 DIAGNOSIS — R69 Illness, unspecified: Secondary | ICD-10-CM | POA: Diagnosis not present

## 2021-12-04 DIAGNOSIS — F1021 Alcohol dependence, in remission: Secondary | ICD-10-CM | POA: Diagnosis not present

## 2021-12-11 DIAGNOSIS — R69 Illness, unspecified: Secondary | ICD-10-CM | POA: Diagnosis not present

## 2021-12-11 DIAGNOSIS — F1021 Alcohol dependence, in remission: Secondary | ICD-10-CM | POA: Diagnosis not present

## 2021-12-11 DIAGNOSIS — R3914 Feeling of incomplete bladder emptying: Secondary | ICD-10-CM | POA: Diagnosis not present

## 2021-12-11 DIAGNOSIS — R3915 Urgency of urination: Secondary | ICD-10-CM | POA: Diagnosis not present

## 2021-12-11 DIAGNOSIS — N401 Enlarged prostate with lower urinary tract symptoms: Secondary | ICD-10-CM | POA: Diagnosis not present

## 2021-12-11 DIAGNOSIS — F1121 Opioid dependence, in remission: Secondary | ICD-10-CM | POA: Diagnosis not present

## 2021-12-11 DIAGNOSIS — R3912 Poor urinary stream: Secondary | ICD-10-CM | POA: Diagnosis not present

## 2021-12-11 DIAGNOSIS — F900 Attention-deficit hyperactivity disorder, predominantly inattentive type: Secondary | ICD-10-CM | POA: Diagnosis not present

## 2021-12-11 DIAGNOSIS — N311 Reflex neuropathic bladder, not elsewhere classified: Secondary | ICD-10-CM | POA: Diagnosis not present

## 2021-12-11 DIAGNOSIS — N486 Induration penis plastica: Secondary | ICD-10-CM | POA: Diagnosis not present

## 2021-12-17 DIAGNOSIS — F1121 Opioid dependence, in remission: Secondary | ICD-10-CM | POA: Diagnosis not present

## 2021-12-17 DIAGNOSIS — R69 Illness, unspecified: Secondary | ICD-10-CM | POA: Diagnosis not present

## 2021-12-17 DIAGNOSIS — F1021 Alcohol dependence, in remission: Secondary | ICD-10-CM | POA: Diagnosis not present

## 2021-12-17 DIAGNOSIS — F900 Attention-deficit hyperactivity disorder, predominantly inattentive type: Secondary | ICD-10-CM | POA: Diagnosis not present

## 2021-12-23 DIAGNOSIS — F1121 Opioid dependence, in remission: Secondary | ICD-10-CM | POA: Diagnosis not present

## 2021-12-23 DIAGNOSIS — F1021 Alcohol dependence, in remission: Secondary | ICD-10-CM | POA: Diagnosis not present

## 2021-12-23 DIAGNOSIS — R69 Illness, unspecified: Secondary | ICD-10-CM | POA: Diagnosis not present

## 2021-12-23 DIAGNOSIS — F902 Attention-deficit hyperactivity disorder, combined type: Secondary | ICD-10-CM | POA: Diagnosis not present

## 2021-12-30 DIAGNOSIS — F902 Attention-deficit hyperactivity disorder, combined type: Secondary | ICD-10-CM | POA: Diagnosis not present

## 2021-12-30 DIAGNOSIS — R69 Illness, unspecified: Secondary | ICD-10-CM | POA: Diagnosis not present

## 2021-12-30 DIAGNOSIS — F1121 Opioid dependence, in remission: Secondary | ICD-10-CM | POA: Diagnosis not present

## 2021-12-30 DIAGNOSIS — F1021 Alcohol dependence, in remission: Secondary | ICD-10-CM | POA: Diagnosis not present

## 2022-01-01 DIAGNOSIS — F900 Attention-deficit hyperactivity disorder, predominantly inattentive type: Secondary | ICD-10-CM | POA: Diagnosis not present

## 2022-01-01 DIAGNOSIS — R69 Illness, unspecified: Secondary | ICD-10-CM | POA: Diagnosis not present

## 2022-01-01 DIAGNOSIS — F1021 Alcohol dependence, in remission: Secondary | ICD-10-CM | POA: Diagnosis not present

## 2022-01-01 DIAGNOSIS — F1121 Opioid dependence, in remission: Secondary | ICD-10-CM | POA: Diagnosis not present

## 2022-01-08 DIAGNOSIS — F900 Attention-deficit hyperactivity disorder, predominantly inattentive type: Secondary | ICD-10-CM | POA: Diagnosis not present

## 2022-01-08 DIAGNOSIS — F1121 Opioid dependence, in remission: Secondary | ICD-10-CM | POA: Diagnosis not present

## 2022-01-08 DIAGNOSIS — F1021 Alcohol dependence, in remission: Secondary | ICD-10-CM | POA: Diagnosis not present

## 2022-01-08 DIAGNOSIS — R69 Illness, unspecified: Secondary | ICD-10-CM | POA: Diagnosis not present

## 2022-01-14 DIAGNOSIS — F900 Attention-deficit hyperactivity disorder, predominantly inattentive type: Secondary | ICD-10-CM | POA: Diagnosis not present

## 2022-01-14 DIAGNOSIS — F1121 Opioid dependence, in remission: Secondary | ICD-10-CM | POA: Diagnosis not present

## 2022-01-14 DIAGNOSIS — R69 Illness, unspecified: Secondary | ICD-10-CM | POA: Diagnosis not present

## 2022-01-14 DIAGNOSIS — F1021 Alcohol dependence, in remission: Secondary | ICD-10-CM | POA: Diagnosis not present

## 2022-01-17 DIAGNOSIS — F900 Attention-deficit hyperactivity disorder, predominantly inattentive type: Secondary | ICD-10-CM | POA: Diagnosis not present

## 2022-01-17 DIAGNOSIS — N529 Male erectile dysfunction, unspecified: Secondary | ICD-10-CM | POA: Diagnosis not present

## 2022-01-17 DIAGNOSIS — R69 Illness, unspecified: Secondary | ICD-10-CM | POA: Diagnosis not present

## 2022-01-17 DIAGNOSIS — F1121 Opioid dependence, in remission: Secondary | ICD-10-CM | POA: Diagnosis not present

## 2022-01-17 DIAGNOSIS — F1021 Alcohol dependence, in remission: Secondary | ICD-10-CM | POA: Diagnosis not present

## 2022-01-28 DIAGNOSIS — F1121 Opioid dependence, in remission: Secondary | ICD-10-CM | POA: Diagnosis not present

## 2022-01-28 DIAGNOSIS — R69 Illness, unspecified: Secondary | ICD-10-CM | POA: Diagnosis not present

## 2022-01-28 DIAGNOSIS — F1021 Alcohol dependence, in remission: Secondary | ICD-10-CM | POA: Diagnosis not present

## 2022-01-28 DIAGNOSIS — F900 Attention-deficit hyperactivity disorder, predominantly inattentive type: Secondary | ICD-10-CM | POA: Diagnosis not present

## 2022-02-05 DIAGNOSIS — F900 Attention-deficit hyperactivity disorder, predominantly inattentive type: Secondary | ICD-10-CM | POA: Diagnosis not present

## 2022-02-05 DIAGNOSIS — F1121 Opioid dependence, in remission: Secondary | ICD-10-CM | POA: Diagnosis not present

## 2022-02-05 DIAGNOSIS — R69 Illness, unspecified: Secondary | ICD-10-CM | POA: Diagnosis not present

## 2022-02-05 DIAGNOSIS — F1021 Alcohol dependence, in remission: Secondary | ICD-10-CM | POA: Diagnosis not present

## 2022-02-07 DIAGNOSIS — F1121 Opioid dependence, in remission: Secondary | ICD-10-CM | POA: Diagnosis not present

## 2022-02-07 DIAGNOSIS — F1021 Alcohol dependence, in remission: Secondary | ICD-10-CM | POA: Diagnosis not present

## 2022-02-07 DIAGNOSIS — F900 Attention-deficit hyperactivity disorder, predominantly inattentive type: Secondary | ICD-10-CM | POA: Diagnosis not present

## 2022-02-07 DIAGNOSIS — R69 Illness, unspecified: Secondary | ICD-10-CM | POA: Diagnosis not present

## 2022-02-14 DIAGNOSIS — F1121 Opioid dependence, in remission: Secondary | ICD-10-CM | POA: Diagnosis not present

## 2022-02-14 DIAGNOSIS — R69 Illness, unspecified: Secondary | ICD-10-CM | POA: Diagnosis not present

## 2022-02-14 DIAGNOSIS — F1021 Alcohol dependence, in remission: Secondary | ICD-10-CM | POA: Diagnosis not present

## 2022-02-14 DIAGNOSIS — F900 Attention-deficit hyperactivity disorder, predominantly inattentive type: Secondary | ICD-10-CM | POA: Diagnosis not present

## 2022-02-19 DIAGNOSIS — F900 Attention-deficit hyperactivity disorder, predominantly inattentive type: Secondary | ICD-10-CM | POA: Diagnosis not present

## 2022-02-19 DIAGNOSIS — F1121 Opioid dependence, in remission: Secondary | ICD-10-CM | POA: Diagnosis not present

## 2022-02-19 DIAGNOSIS — R69 Illness, unspecified: Secondary | ICD-10-CM | POA: Diagnosis not present

## 2022-02-19 DIAGNOSIS — F1021 Alcohol dependence, in remission: Secondary | ICD-10-CM | POA: Diagnosis not present

## 2022-02-26 DIAGNOSIS — F1121 Opioid dependence, in remission: Secondary | ICD-10-CM | POA: Diagnosis not present

## 2022-02-26 DIAGNOSIS — F1021 Alcohol dependence, in remission: Secondary | ICD-10-CM | POA: Diagnosis not present

## 2022-02-26 DIAGNOSIS — F900 Attention-deficit hyperactivity disorder, predominantly inattentive type: Secondary | ICD-10-CM | POA: Diagnosis not present

## 2022-02-26 DIAGNOSIS — R69 Illness, unspecified: Secondary | ICD-10-CM | POA: Diagnosis not present

## 2022-03-04 DIAGNOSIS — F1021 Alcohol dependence, in remission: Secondary | ICD-10-CM | POA: Diagnosis not present

## 2022-03-04 DIAGNOSIS — F1121 Opioid dependence, in remission: Secondary | ICD-10-CM | POA: Diagnosis not present

## 2022-03-04 DIAGNOSIS — F900 Attention-deficit hyperactivity disorder, predominantly inattentive type: Secondary | ICD-10-CM | POA: Diagnosis not present

## 2022-03-04 DIAGNOSIS — R69 Illness, unspecified: Secondary | ICD-10-CM | POA: Diagnosis not present

## 2022-03-19 DIAGNOSIS — R69 Illness, unspecified: Secondary | ICD-10-CM | POA: Diagnosis not present

## 2022-03-19 DIAGNOSIS — F902 Attention-deficit hyperactivity disorder, combined type: Secondary | ICD-10-CM | POA: Diagnosis not present

## 2022-03-19 DIAGNOSIS — F1021 Alcohol dependence, in remission: Secondary | ICD-10-CM | POA: Diagnosis not present

## 2022-03-19 DIAGNOSIS — F1121 Opioid dependence, in remission: Secondary | ICD-10-CM | POA: Diagnosis not present

## 2022-03-26 DIAGNOSIS — F1021 Alcohol dependence, in remission: Secondary | ICD-10-CM | POA: Diagnosis not present

## 2022-03-26 DIAGNOSIS — R69 Illness, unspecified: Secondary | ICD-10-CM | POA: Diagnosis not present

## 2022-03-26 DIAGNOSIS — F902 Attention-deficit hyperactivity disorder, combined type: Secondary | ICD-10-CM | POA: Diagnosis not present

## 2022-03-26 DIAGNOSIS — F1121 Opioid dependence, in remission: Secondary | ICD-10-CM | POA: Diagnosis not present

## 2022-04-04 DIAGNOSIS — F900 Attention-deficit hyperactivity disorder, predominantly inattentive type: Secondary | ICD-10-CM | POA: Diagnosis not present

## 2022-04-04 DIAGNOSIS — F1121 Opioid dependence, in remission: Secondary | ICD-10-CM | POA: Diagnosis not present

## 2022-04-04 DIAGNOSIS — R69 Illness, unspecified: Secondary | ICD-10-CM | POA: Diagnosis not present

## 2022-04-04 DIAGNOSIS — F1021 Alcohol dependence, in remission: Secondary | ICD-10-CM | POA: Diagnosis not present

## 2022-04-08 DIAGNOSIS — F900 Attention-deficit hyperactivity disorder, predominantly inattentive type: Secondary | ICD-10-CM | POA: Diagnosis not present

## 2022-04-08 DIAGNOSIS — F1121 Opioid dependence, in remission: Secondary | ICD-10-CM | POA: Diagnosis not present

## 2022-04-08 DIAGNOSIS — F1021 Alcohol dependence, in remission: Secondary | ICD-10-CM | POA: Diagnosis not present

## 2022-04-08 DIAGNOSIS — R69 Illness, unspecified: Secondary | ICD-10-CM | POA: Diagnosis not present

## 2022-04-09 DIAGNOSIS — F1021 Alcohol dependence, in remission: Secondary | ICD-10-CM | POA: Diagnosis not present

## 2022-04-09 DIAGNOSIS — F1121 Opioid dependence, in remission: Secondary | ICD-10-CM | POA: Diagnosis not present

## 2022-04-09 DIAGNOSIS — F900 Attention-deficit hyperactivity disorder, predominantly inattentive type: Secondary | ICD-10-CM | POA: Diagnosis not present

## 2022-04-09 DIAGNOSIS — R69 Illness, unspecified: Secondary | ICD-10-CM | POA: Diagnosis not present

## 2022-04-16 DIAGNOSIS — F900 Attention-deficit hyperactivity disorder, predominantly inattentive type: Secondary | ICD-10-CM | POA: Diagnosis not present

## 2022-04-16 DIAGNOSIS — F1021 Alcohol dependence, in remission: Secondary | ICD-10-CM | POA: Diagnosis not present

## 2022-04-16 DIAGNOSIS — R69 Illness, unspecified: Secondary | ICD-10-CM | POA: Diagnosis not present

## 2022-04-16 DIAGNOSIS — F1121 Opioid dependence, in remission: Secondary | ICD-10-CM | POA: Diagnosis not present

## 2022-04-23 DIAGNOSIS — F1021 Alcohol dependence, in remission: Secondary | ICD-10-CM | POA: Diagnosis not present

## 2022-04-23 DIAGNOSIS — F1121 Opioid dependence, in remission: Secondary | ICD-10-CM | POA: Diagnosis not present

## 2022-04-23 DIAGNOSIS — F902 Attention-deficit hyperactivity disorder, combined type: Secondary | ICD-10-CM | POA: Diagnosis not present

## 2022-04-23 DIAGNOSIS — R69 Illness, unspecified: Secondary | ICD-10-CM | POA: Diagnosis not present

## 2022-04-30 DIAGNOSIS — L570 Actinic keratosis: Secondary | ICD-10-CM | POA: Diagnosis not present

## 2022-04-30 DIAGNOSIS — B078 Other viral warts: Secondary | ICD-10-CM | POA: Diagnosis not present

## 2022-04-30 DIAGNOSIS — X32XXXD Exposure to sunlight, subsequent encounter: Secondary | ICD-10-CM | POA: Diagnosis not present

## 2022-05-07 DIAGNOSIS — Z0000 Encounter for general adult medical examination without abnormal findings: Secondary | ICD-10-CM | POA: Diagnosis not present

## 2022-05-07 DIAGNOSIS — N319 Neuromuscular dysfunction of bladder, unspecified: Secondary | ICD-10-CM | POA: Diagnosis not present

## 2022-05-07 DIAGNOSIS — I10 Essential (primary) hypertension: Secondary | ICD-10-CM | POA: Diagnosis not present

## 2022-05-07 DIAGNOSIS — F1121 Opioid dependence, in remission: Secondary | ICD-10-CM | POA: Diagnosis not present

## 2022-05-07 DIAGNOSIS — G4700 Insomnia, unspecified: Secondary | ICD-10-CM | POA: Diagnosis not present

## 2022-05-07 DIAGNOSIS — R69 Illness, unspecified: Secondary | ICD-10-CM | POA: Diagnosis not present

## 2022-05-07 DIAGNOSIS — N400 Enlarged prostate without lower urinary tract symptoms: Secondary | ICD-10-CM | POA: Diagnosis not present

## 2022-05-07 DIAGNOSIS — F902 Attention-deficit hyperactivity disorder, combined type: Secondary | ICD-10-CM | POA: Diagnosis not present

## 2022-05-07 DIAGNOSIS — E1169 Type 2 diabetes mellitus with other specified complication: Secondary | ICD-10-CM | POA: Diagnosis not present

## 2022-05-07 DIAGNOSIS — Z23 Encounter for immunization: Secondary | ICD-10-CM | POA: Diagnosis not present

## 2022-05-07 DIAGNOSIS — F1021 Alcohol dependence, in remission: Secondary | ICD-10-CM | POA: Diagnosis not present

## 2022-05-07 DIAGNOSIS — E7800 Pure hypercholesterolemia, unspecified: Secondary | ICD-10-CM | POA: Diagnosis not present

## 2022-05-13 DIAGNOSIS — F1121 Opioid dependence, in remission: Secondary | ICD-10-CM | POA: Diagnosis not present

## 2022-05-13 DIAGNOSIS — R69 Illness, unspecified: Secondary | ICD-10-CM | POA: Diagnosis not present

## 2022-05-13 DIAGNOSIS — F1021 Alcohol dependence, in remission: Secondary | ICD-10-CM | POA: Diagnosis not present

## 2022-05-13 DIAGNOSIS — F902 Attention-deficit hyperactivity disorder, combined type: Secondary | ICD-10-CM | POA: Diagnosis not present

## 2022-05-20 DIAGNOSIS — F1021 Alcohol dependence, in remission: Secondary | ICD-10-CM | POA: Diagnosis not present

## 2022-05-20 DIAGNOSIS — F902 Attention-deficit hyperactivity disorder, combined type: Secondary | ICD-10-CM | POA: Diagnosis not present

## 2022-05-20 DIAGNOSIS — F1121 Opioid dependence, in remission: Secondary | ICD-10-CM | POA: Diagnosis not present

## 2022-05-20 DIAGNOSIS — R69 Illness, unspecified: Secondary | ICD-10-CM | POA: Diagnosis not present

## 2022-05-21 DIAGNOSIS — Z79899 Other long term (current) drug therapy: Secondary | ICD-10-CM | POA: Diagnosis not present

## 2022-05-21 DIAGNOSIS — F900 Attention-deficit hyperactivity disorder, predominantly inattentive type: Secondary | ICD-10-CM | POA: Diagnosis not present

## 2022-05-21 DIAGNOSIS — F1021 Alcohol dependence, in remission: Secondary | ICD-10-CM | POA: Diagnosis not present

## 2022-05-21 DIAGNOSIS — R69 Illness, unspecified: Secondary | ICD-10-CM | POA: Diagnosis not present

## 2022-05-21 DIAGNOSIS — F1121 Opioid dependence, in remission: Secondary | ICD-10-CM | POA: Diagnosis not present

## 2022-05-30 DIAGNOSIS — F1121 Opioid dependence, in remission: Secondary | ICD-10-CM | POA: Diagnosis not present

## 2022-05-30 DIAGNOSIS — F900 Attention-deficit hyperactivity disorder, predominantly inattentive type: Secondary | ICD-10-CM | POA: Diagnosis not present

## 2022-05-30 DIAGNOSIS — R69 Illness, unspecified: Secondary | ICD-10-CM | POA: Diagnosis not present

## 2022-05-30 DIAGNOSIS — F1021 Alcohol dependence, in remission: Secondary | ICD-10-CM | POA: Diagnosis not present

## 2022-06-04 DIAGNOSIS — F1021 Alcohol dependence, in remission: Secondary | ICD-10-CM | POA: Diagnosis not present

## 2022-06-04 DIAGNOSIS — F1121 Opioid dependence, in remission: Secondary | ICD-10-CM | POA: Diagnosis not present

## 2022-06-04 DIAGNOSIS — F900 Attention-deficit hyperactivity disorder, predominantly inattentive type: Secondary | ICD-10-CM | POA: Diagnosis not present

## 2022-06-04 DIAGNOSIS — R69 Illness, unspecified: Secondary | ICD-10-CM | POA: Diagnosis not present

## 2022-06-11 DIAGNOSIS — F1121 Opioid dependence, in remission: Secondary | ICD-10-CM | POA: Diagnosis not present

## 2022-06-11 DIAGNOSIS — F1021 Alcohol dependence, in remission: Secondary | ICD-10-CM | POA: Diagnosis not present

## 2022-06-11 DIAGNOSIS — R69 Illness, unspecified: Secondary | ICD-10-CM | POA: Diagnosis not present

## 2022-06-11 DIAGNOSIS — F900 Attention-deficit hyperactivity disorder, predominantly inattentive type: Secondary | ICD-10-CM | POA: Diagnosis not present

## 2022-06-18 DIAGNOSIS — F902 Attention-deficit hyperactivity disorder, combined type: Secondary | ICD-10-CM | POA: Diagnosis not present

## 2022-06-18 DIAGNOSIS — F1121 Opioid dependence, in remission: Secondary | ICD-10-CM | POA: Diagnosis not present

## 2022-06-18 DIAGNOSIS — R69 Illness, unspecified: Secondary | ICD-10-CM | POA: Diagnosis not present

## 2022-06-18 DIAGNOSIS — F1021 Alcohol dependence, in remission: Secondary | ICD-10-CM | POA: Diagnosis not present

## 2022-06-20 DIAGNOSIS — F900 Attention-deficit hyperactivity disorder, predominantly inattentive type: Secondary | ICD-10-CM | POA: Diagnosis not present

## 2022-06-20 DIAGNOSIS — R69 Illness, unspecified: Secondary | ICD-10-CM | POA: Diagnosis not present

## 2022-06-20 DIAGNOSIS — F1121 Opioid dependence, in remission: Secondary | ICD-10-CM | POA: Diagnosis not present

## 2022-06-20 DIAGNOSIS — F1021 Alcohol dependence, in remission: Secondary | ICD-10-CM | POA: Diagnosis not present

## 2022-06-25 DIAGNOSIS — R3915 Urgency of urination: Secondary | ICD-10-CM | POA: Diagnosis not present

## 2022-06-25 DIAGNOSIS — N5201 Erectile dysfunction due to arterial insufficiency: Secondary | ICD-10-CM | POA: Diagnosis not present

## 2022-06-25 DIAGNOSIS — R3914 Feeling of incomplete bladder emptying: Secondary | ICD-10-CM | POA: Diagnosis not present

## 2022-06-25 DIAGNOSIS — N401 Enlarged prostate with lower urinary tract symptoms: Secondary | ICD-10-CM | POA: Diagnosis not present

## 2022-06-25 DIAGNOSIS — N311 Reflex neuropathic bladder, not elsewhere classified: Secondary | ICD-10-CM | POA: Diagnosis not present

## 2022-06-25 DIAGNOSIS — R3912 Poor urinary stream: Secondary | ICD-10-CM | POA: Diagnosis not present

## 2022-07-02 DIAGNOSIS — F900 Attention-deficit hyperactivity disorder, predominantly inattentive type: Secondary | ICD-10-CM | POA: Diagnosis not present

## 2022-07-02 DIAGNOSIS — F1121 Opioid dependence, in remission: Secondary | ICD-10-CM | POA: Diagnosis not present

## 2022-07-02 DIAGNOSIS — F1021 Alcohol dependence, in remission: Secondary | ICD-10-CM | POA: Diagnosis not present

## 2022-07-02 DIAGNOSIS — R69 Illness, unspecified: Secondary | ICD-10-CM | POA: Diagnosis not present

## 2022-07-09 DIAGNOSIS — R69 Illness, unspecified: Secondary | ICD-10-CM | POA: Diagnosis not present

## 2022-07-09 DIAGNOSIS — F1021 Alcohol dependence, in remission: Secondary | ICD-10-CM | POA: Diagnosis not present

## 2022-07-09 DIAGNOSIS — F900 Attention-deficit hyperactivity disorder, predominantly inattentive type: Secondary | ICD-10-CM | POA: Diagnosis not present

## 2022-07-09 DIAGNOSIS — F1121 Opioid dependence, in remission: Secondary | ICD-10-CM | POA: Diagnosis not present

## 2022-07-17 DIAGNOSIS — R69 Illness, unspecified: Secondary | ICD-10-CM | POA: Diagnosis not present

## 2022-07-17 DIAGNOSIS — F902 Attention-deficit hyperactivity disorder, combined type: Secondary | ICD-10-CM | POA: Diagnosis not present

## 2022-07-17 DIAGNOSIS — F1021 Alcohol dependence, in remission: Secondary | ICD-10-CM | POA: Diagnosis not present

## 2022-07-17 DIAGNOSIS — F1121 Opioid dependence, in remission: Secondary | ICD-10-CM | POA: Diagnosis not present

## 2022-07-18 DIAGNOSIS — F900 Attention-deficit hyperactivity disorder, predominantly inattentive type: Secondary | ICD-10-CM | POA: Diagnosis not present

## 2022-07-18 DIAGNOSIS — F1121 Opioid dependence, in remission: Secondary | ICD-10-CM | POA: Diagnosis not present

## 2022-07-18 DIAGNOSIS — F1021 Alcohol dependence, in remission: Secondary | ICD-10-CM | POA: Diagnosis not present

## 2022-07-18 DIAGNOSIS — R69 Illness, unspecified: Secondary | ICD-10-CM | POA: Diagnosis not present

## 2022-07-22 DIAGNOSIS — F900 Attention-deficit hyperactivity disorder, predominantly inattentive type: Secondary | ICD-10-CM | POA: Diagnosis not present

## 2022-07-22 DIAGNOSIS — R69 Illness, unspecified: Secondary | ICD-10-CM | POA: Diagnosis not present

## 2022-07-22 DIAGNOSIS — F1021 Alcohol dependence, in remission: Secondary | ICD-10-CM | POA: Diagnosis not present

## 2022-07-22 DIAGNOSIS — F1121 Opioid dependence, in remission: Secondary | ICD-10-CM | POA: Diagnosis not present

## 2022-07-25 DIAGNOSIS — N400 Enlarged prostate without lower urinary tract symptoms: Secondary | ICD-10-CM | POA: Diagnosis not present

## 2022-07-25 DIAGNOSIS — I10 Essential (primary) hypertension: Secondary | ICD-10-CM | POA: Diagnosis not present

## 2022-07-25 DIAGNOSIS — R42 Dizziness and giddiness: Secondary | ICD-10-CM | POA: Diagnosis not present

## 2022-07-25 DIAGNOSIS — E1165 Type 2 diabetes mellitus with hyperglycemia: Secondary | ICD-10-CM | POA: Diagnosis not present

## 2022-07-25 DIAGNOSIS — R69 Illness, unspecified: Secondary | ICD-10-CM | POA: Diagnosis not present

## 2022-07-30 DIAGNOSIS — F1121 Opioid dependence, in remission: Secondary | ICD-10-CM | POA: Diagnosis not present

## 2022-07-30 DIAGNOSIS — R69 Illness, unspecified: Secondary | ICD-10-CM | POA: Diagnosis not present

## 2022-07-30 DIAGNOSIS — F1021 Alcohol dependence, in remission: Secondary | ICD-10-CM | POA: Diagnosis not present

## 2022-07-30 DIAGNOSIS — F900 Attention-deficit hyperactivity disorder, predominantly inattentive type: Secondary | ICD-10-CM | POA: Diagnosis not present

## 2022-07-31 DIAGNOSIS — E1165 Type 2 diabetes mellitus with hyperglycemia: Secondary | ICD-10-CM | POA: Diagnosis not present

## 2022-08-06 DIAGNOSIS — F1021 Alcohol dependence, in remission: Secondary | ICD-10-CM | POA: Diagnosis not present

## 2022-08-06 DIAGNOSIS — F1121 Opioid dependence, in remission: Secondary | ICD-10-CM | POA: Diagnosis not present

## 2022-08-06 DIAGNOSIS — F900 Attention-deficit hyperactivity disorder, predominantly inattentive type: Secondary | ICD-10-CM | POA: Diagnosis not present

## 2022-08-06 DIAGNOSIS — R69 Illness, unspecified: Secondary | ICD-10-CM | POA: Diagnosis not present

## 2022-08-12 DIAGNOSIS — H9190 Unspecified hearing loss, unspecified ear: Secondary | ICD-10-CM | POA: Diagnosis not present

## 2022-08-12 DIAGNOSIS — E1169 Type 2 diabetes mellitus with other specified complication: Secondary | ICD-10-CM | POA: Diagnosis not present

## 2022-08-12 DIAGNOSIS — N400 Enlarged prostate without lower urinary tract symptoms: Secondary | ICD-10-CM | POA: Diagnosis not present

## 2022-08-12 DIAGNOSIS — R69 Illness, unspecified: Secondary | ICD-10-CM | POA: Diagnosis not present

## 2022-08-12 DIAGNOSIS — I10 Essential (primary) hypertension: Secondary | ICD-10-CM | POA: Diagnosis not present

## 2022-08-18 DIAGNOSIS — F1121 Opioid dependence, in remission: Secondary | ICD-10-CM | POA: Diagnosis not present

## 2022-08-18 DIAGNOSIS — F1021 Alcohol dependence, in remission: Secondary | ICD-10-CM | POA: Diagnosis not present

## 2022-08-18 DIAGNOSIS — R69 Illness, unspecified: Secondary | ICD-10-CM | POA: Diagnosis not present

## 2022-08-18 DIAGNOSIS — F900 Attention-deficit hyperactivity disorder, predominantly inattentive type: Secondary | ICD-10-CM | POA: Diagnosis not present

## 2022-09-02 DIAGNOSIS — F1021 Alcohol dependence, in remission: Secondary | ICD-10-CM | POA: Diagnosis not present

## 2022-09-02 DIAGNOSIS — R69 Illness, unspecified: Secondary | ICD-10-CM | POA: Diagnosis not present

## 2022-09-02 DIAGNOSIS — F1121 Opioid dependence, in remission: Secondary | ICD-10-CM | POA: Diagnosis not present

## 2022-09-02 DIAGNOSIS — F900 Attention-deficit hyperactivity disorder, predominantly inattentive type: Secondary | ICD-10-CM | POA: Diagnosis not present

## 2022-09-08 DIAGNOSIS — H903 Sensorineural hearing loss, bilateral: Secondary | ICD-10-CM | POA: Diagnosis not present

## 2022-09-09 DIAGNOSIS — F1021 Alcohol dependence, in remission: Secondary | ICD-10-CM | POA: Diagnosis not present

## 2022-09-09 DIAGNOSIS — R69 Illness, unspecified: Secondary | ICD-10-CM | POA: Diagnosis not present

## 2022-09-09 DIAGNOSIS — H35033 Hypertensive retinopathy, bilateral: Secondary | ICD-10-CM | POA: Diagnosis not present

## 2022-09-09 DIAGNOSIS — H2513 Age-related nuclear cataract, bilateral: Secondary | ICD-10-CM | POA: Diagnosis not present

## 2022-09-09 DIAGNOSIS — H524 Presbyopia: Secondary | ICD-10-CM | POA: Diagnosis not present

## 2022-09-09 DIAGNOSIS — F1121 Opioid dependence, in remission: Secondary | ICD-10-CM | POA: Diagnosis not present

## 2022-09-09 DIAGNOSIS — D3131 Benign neoplasm of right choroid: Secondary | ICD-10-CM | POA: Diagnosis not present

## 2022-09-09 DIAGNOSIS — H52223 Regular astigmatism, bilateral: Secondary | ICD-10-CM | POA: Diagnosis not present

## 2022-09-09 DIAGNOSIS — F900 Attention-deficit hyperactivity disorder, predominantly inattentive type: Secondary | ICD-10-CM | POA: Diagnosis not present

## 2022-09-15 DIAGNOSIS — F900 Attention-deficit hyperactivity disorder, predominantly inattentive type: Secondary | ICD-10-CM | POA: Diagnosis not present

## 2022-09-15 DIAGNOSIS — R69 Illness, unspecified: Secondary | ICD-10-CM | POA: Diagnosis not present

## 2022-09-15 DIAGNOSIS — F1021 Alcohol dependence, in remission: Secondary | ICD-10-CM | POA: Diagnosis not present

## 2022-09-15 DIAGNOSIS — F1121 Opioid dependence, in remission: Secondary | ICD-10-CM | POA: Diagnosis not present

## 2022-09-16 DIAGNOSIS — F1021 Alcohol dependence, in remission: Secondary | ICD-10-CM | POA: Diagnosis not present

## 2022-09-16 DIAGNOSIS — F1121 Opioid dependence, in remission: Secondary | ICD-10-CM | POA: Diagnosis not present

## 2022-09-16 DIAGNOSIS — F900 Attention-deficit hyperactivity disorder, predominantly inattentive type: Secondary | ICD-10-CM | POA: Diagnosis not present

## 2022-09-16 DIAGNOSIS — R69 Illness, unspecified: Secondary | ICD-10-CM | POA: Diagnosis not present

## 2022-09-23 DIAGNOSIS — R69 Illness, unspecified: Secondary | ICD-10-CM | POA: Diagnosis not present

## 2022-09-23 DIAGNOSIS — F900 Attention-deficit hyperactivity disorder, predominantly inattentive type: Secondary | ICD-10-CM | POA: Diagnosis not present

## 2022-09-23 DIAGNOSIS — F1121 Opioid dependence, in remission: Secondary | ICD-10-CM | POA: Diagnosis not present

## 2022-09-23 DIAGNOSIS — F1021 Alcohol dependence, in remission: Secondary | ICD-10-CM | POA: Diagnosis not present

## 2022-10-01 DIAGNOSIS — F1121 Opioid dependence, in remission: Secondary | ICD-10-CM | POA: Diagnosis not present

## 2022-10-01 DIAGNOSIS — F1021 Alcohol dependence, in remission: Secondary | ICD-10-CM | POA: Diagnosis not present

## 2022-10-01 DIAGNOSIS — F900 Attention-deficit hyperactivity disorder, predominantly inattentive type: Secondary | ICD-10-CM | POA: Diagnosis not present

## 2022-10-01 DIAGNOSIS — R69 Illness, unspecified: Secondary | ICD-10-CM | POA: Diagnosis not present

## 2022-10-13 DIAGNOSIS — F1121 Opioid dependence, in remission: Secondary | ICD-10-CM | POA: Diagnosis not present

## 2022-10-13 DIAGNOSIS — F900 Attention-deficit hyperactivity disorder, predominantly inattentive type: Secondary | ICD-10-CM | POA: Diagnosis not present

## 2022-10-13 DIAGNOSIS — F1021 Alcohol dependence, in remission: Secondary | ICD-10-CM | POA: Diagnosis not present

## 2022-10-13 DIAGNOSIS — R69 Illness, unspecified: Secondary | ICD-10-CM | POA: Diagnosis not present

## 2022-10-15 DIAGNOSIS — R69 Illness, unspecified: Secondary | ICD-10-CM | POA: Diagnosis not present

## 2022-10-15 DIAGNOSIS — F902 Attention-deficit hyperactivity disorder, combined type: Secondary | ICD-10-CM | POA: Diagnosis not present

## 2022-10-15 DIAGNOSIS — F1021 Alcohol dependence, in remission: Secondary | ICD-10-CM | POA: Diagnosis not present

## 2022-10-15 DIAGNOSIS — F1121 Opioid dependence, in remission: Secondary | ICD-10-CM | POA: Diagnosis not present

## 2022-10-22 DIAGNOSIS — F1021 Alcohol dependence, in remission: Secondary | ICD-10-CM | POA: Diagnosis not present

## 2022-10-22 DIAGNOSIS — R69 Illness, unspecified: Secondary | ICD-10-CM | POA: Diagnosis not present

## 2022-10-22 DIAGNOSIS — F902 Attention-deficit hyperactivity disorder, combined type: Secondary | ICD-10-CM | POA: Diagnosis not present

## 2022-10-22 DIAGNOSIS — F1121 Opioid dependence, in remission: Secondary | ICD-10-CM | POA: Diagnosis not present

## 2022-10-29 DIAGNOSIS — R69 Illness, unspecified: Secondary | ICD-10-CM | POA: Diagnosis not present

## 2022-10-29 DIAGNOSIS — F1021 Alcohol dependence, in remission: Secondary | ICD-10-CM | POA: Diagnosis not present

## 2022-10-29 DIAGNOSIS — F1121 Opioid dependence, in remission: Secondary | ICD-10-CM | POA: Diagnosis not present

## 2022-10-29 DIAGNOSIS — F902 Attention-deficit hyperactivity disorder, combined type: Secondary | ICD-10-CM | POA: Diagnosis not present

## 2022-11-04 DIAGNOSIS — E7800 Pure hypercholesterolemia, unspecified: Secondary | ICD-10-CM | POA: Diagnosis not present

## 2022-11-04 DIAGNOSIS — G4700 Insomnia, unspecified: Secondary | ICD-10-CM | POA: Diagnosis not present

## 2022-11-04 DIAGNOSIS — F419 Anxiety disorder, unspecified: Secondary | ICD-10-CM | POA: Diagnosis not present

## 2022-11-04 DIAGNOSIS — K219 Gastro-esophageal reflux disease without esophagitis: Secondary | ICD-10-CM | POA: Diagnosis not present

## 2022-11-04 DIAGNOSIS — F909 Attention-deficit hyperactivity disorder, unspecified type: Secondary | ICD-10-CM | POA: Diagnosis not present

## 2022-11-04 DIAGNOSIS — F1021 Alcohol dependence, in remission: Secondary | ICD-10-CM | POA: Diagnosis not present

## 2022-11-04 DIAGNOSIS — F331 Major depressive disorder, recurrent, moderate: Secondary | ICD-10-CM | POA: Diagnosis not present

## 2022-11-04 DIAGNOSIS — I10 Essential (primary) hypertension: Secondary | ICD-10-CM | POA: Diagnosis not present

## 2022-11-04 DIAGNOSIS — E1136 Type 2 diabetes mellitus with diabetic cataract: Secondary | ICD-10-CM | POA: Diagnosis not present

## 2022-11-04 DIAGNOSIS — N400 Enlarged prostate without lower urinary tract symptoms: Secondary | ICD-10-CM | POA: Diagnosis not present

## 2022-11-05 DIAGNOSIS — F1021 Alcohol dependence, in remission: Secondary | ICD-10-CM | POA: Diagnosis not present

## 2022-11-05 DIAGNOSIS — R69 Illness, unspecified: Secondary | ICD-10-CM | POA: Diagnosis not present

## 2022-11-05 DIAGNOSIS — F902 Attention-deficit hyperactivity disorder, combined type: Secondary | ICD-10-CM | POA: Diagnosis not present

## 2022-11-05 DIAGNOSIS — F1121 Opioid dependence, in remission: Secondary | ICD-10-CM | POA: Diagnosis not present

## 2022-11-11 DIAGNOSIS — F1021 Alcohol dependence, in remission: Secondary | ICD-10-CM | POA: Diagnosis not present

## 2022-11-11 DIAGNOSIS — R69 Illness, unspecified: Secondary | ICD-10-CM | POA: Diagnosis not present

## 2022-11-11 DIAGNOSIS — F1121 Opioid dependence, in remission: Secondary | ICD-10-CM | POA: Diagnosis not present

## 2022-11-11 DIAGNOSIS — F902 Attention-deficit hyperactivity disorder, combined type: Secondary | ICD-10-CM | POA: Diagnosis not present

## 2022-11-18 DIAGNOSIS — F1021 Alcohol dependence, in remission: Secondary | ICD-10-CM | POA: Diagnosis not present

## 2022-11-18 DIAGNOSIS — F1121 Opioid dependence, in remission: Secondary | ICD-10-CM | POA: Diagnosis not present

## 2022-11-18 DIAGNOSIS — F900 Attention-deficit hyperactivity disorder, predominantly inattentive type: Secondary | ICD-10-CM | POA: Diagnosis not present

## 2022-11-25 DIAGNOSIS — F1121 Opioid dependence, in remission: Secondary | ICD-10-CM | POA: Diagnosis not present

## 2022-11-25 DIAGNOSIS — F1021 Alcohol dependence, in remission: Secondary | ICD-10-CM | POA: Diagnosis not present

## 2022-11-25 DIAGNOSIS — F900 Attention-deficit hyperactivity disorder, predominantly inattentive type: Secondary | ICD-10-CM | POA: Diagnosis not present

## 2022-11-26 DIAGNOSIS — F900 Attention-deficit hyperactivity disorder, predominantly inattentive type: Secondary | ICD-10-CM | POA: Diagnosis not present

## 2022-11-26 DIAGNOSIS — F1021 Alcohol dependence, in remission: Secondary | ICD-10-CM | POA: Diagnosis not present

## 2022-11-26 DIAGNOSIS — F1121 Opioid dependence, in remission: Secondary | ICD-10-CM | POA: Diagnosis not present

## 2022-12-01 DIAGNOSIS — F900 Attention-deficit hyperactivity disorder, predominantly inattentive type: Secondary | ICD-10-CM | POA: Diagnosis not present

## 2022-12-01 DIAGNOSIS — F1121 Opioid dependence, in remission: Secondary | ICD-10-CM | POA: Diagnosis not present

## 2022-12-01 DIAGNOSIS — F1021 Alcohol dependence, in remission: Secondary | ICD-10-CM | POA: Diagnosis not present

## 2022-12-17 DIAGNOSIS — F1121 Opioid dependence, in remission: Secondary | ICD-10-CM | POA: Diagnosis not present

## 2022-12-17 DIAGNOSIS — F1021 Alcohol dependence, in remission: Secondary | ICD-10-CM | POA: Diagnosis not present

## 2022-12-17 DIAGNOSIS — F900 Attention-deficit hyperactivity disorder, predominantly inattentive type: Secondary | ICD-10-CM | POA: Diagnosis not present

## 2023-01-01 DIAGNOSIS — F1121 Opioid dependence, in remission: Secondary | ICD-10-CM | POA: Diagnosis not present

## 2023-01-01 DIAGNOSIS — F1021 Alcohol dependence, in remission: Secondary | ICD-10-CM | POA: Diagnosis not present

## 2023-01-01 DIAGNOSIS — N401 Enlarged prostate with lower urinary tract symptoms: Secondary | ICD-10-CM | POA: Diagnosis not present

## 2023-01-01 DIAGNOSIS — F902 Attention-deficit hyperactivity disorder, combined type: Secondary | ICD-10-CM | POA: Diagnosis not present

## 2023-01-01 DIAGNOSIS — R3914 Feeling of incomplete bladder emptying: Secondary | ICD-10-CM | POA: Diagnosis not present

## 2023-01-01 DIAGNOSIS — N5201 Erectile dysfunction due to arterial insufficiency: Secondary | ICD-10-CM | POA: Diagnosis not present

## 2023-01-02 DIAGNOSIS — F1021 Alcohol dependence, in remission: Secondary | ICD-10-CM | POA: Diagnosis not present

## 2023-01-02 DIAGNOSIS — F1121 Opioid dependence, in remission: Secondary | ICD-10-CM | POA: Diagnosis not present

## 2023-01-02 DIAGNOSIS — F900 Attention-deficit hyperactivity disorder, predominantly inattentive type: Secondary | ICD-10-CM | POA: Diagnosis not present

## 2023-01-08 DIAGNOSIS — R197 Diarrhea, unspecified: Secondary | ICD-10-CM | POA: Diagnosis not present

## 2023-01-08 DIAGNOSIS — F900 Attention-deficit hyperactivity disorder, predominantly inattentive type: Secondary | ICD-10-CM | POA: Diagnosis not present

## 2023-01-15 DIAGNOSIS — F1121 Opioid dependence, in remission: Secondary | ICD-10-CM | POA: Diagnosis not present

## 2023-01-15 DIAGNOSIS — F900 Attention-deficit hyperactivity disorder, predominantly inattentive type: Secondary | ICD-10-CM | POA: Diagnosis not present

## 2023-01-15 DIAGNOSIS — F1021 Alcohol dependence, in remission: Secondary | ICD-10-CM | POA: Diagnosis not present

## 2023-01-19 DIAGNOSIS — F902 Attention-deficit hyperactivity disorder, combined type: Secondary | ICD-10-CM | POA: Diagnosis not present

## 2023-01-27 DIAGNOSIS — R197 Diarrhea, unspecified: Secondary | ICD-10-CM | POA: Diagnosis not present

## 2023-01-27 DIAGNOSIS — E1136 Type 2 diabetes mellitus with diabetic cataract: Secondary | ICD-10-CM | POA: Diagnosis not present

## 2023-02-10 DIAGNOSIS — F1021 Alcohol dependence, in remission: Secondary | ICD-10-CM | POA: Diagnosis not present

## 2023-02-10 DIAGNOSIS — F1121 Opioid dependence, in remission: Secondary | ICD-10-CM | POA: Diagnosis not present

## 2023-02-10 DIAGNOSIS — F902 Attention-deficit hyperactivity disorder, combined type: Secondary | ICD-10-CM | POA: Diagnosis not present

## 2023-02-17 DIAGNOSIS — F902 Attention-deficit hyperactivity disorder, combined type: Secondary | ICD-10-CM | POA: Diagnosis not present

## 2023-02-17 DIAGNOSIS — F1021 Alcohol dependence, in remission: Secondary | ICD-10-CM | POA: Diagnosis not present

## 2023-02-17 DIAGNOSIS — F1121 Opioid dependence, in remission: Secondary | ICD-10-CM | POA: Diagnosis not present

## 2023-02-25 DIAGNOSIS — F1021 Alcohol dependence, in remission: Secondary | ICD-10-CM | POA: Diagnosis not present

## 2023-02-25 DIAGNOSIS — F900 Attention-deficit hyperactivity disorder, predominantly inattentive type: Secondary | ICD-10-CM | POA: Diagnosis not present

## 2023-02-25 DIAGNOSIS — F1121 Opioid dependence, in remission: Secondary | ICD-10-CM | POA: Diagnosis not present

## 2023-03-04 DIAGNOSIS — F1021 Alcohol dependence, in remission: Secondary | ICD-10-CM | POA: Diagnosis not present

## 2023-03-04 DIAGNOSIS — F900 Attention-deficit hyperactivity disorder, predominantly inattentive type: Secondary | ICD-10-CM | POA: Diagnosis not present

## 2023-03-04 DIAGNOSIS — F1121 Opioid dependence, in remission: Secondary | ICD-10-CM | POA: Diagnosis not present

## 2023-03-11 DIAGNOSIS — F1121 Opioid dependence, in remission: Secondary | ICD-10-CM | POA: Diagnosis not present

## 2023-03-11 DIAGNOSIS — F1021 Alcohol dependence, in remission: Secondary | ICD-10-CM | POA: Diagnosis not present

## 2023-03-11 DIAGNOSIS — F900 Attention-deficit hyperactivity disorder, predominantly inattentive type: Secondary | ICD-10-CM | POA: Diagnosis not present

## 2023-03-18 DIAGNOSIS — F1021 Alcohol dependence, in remission: Secondary | ICD-10-CM | POA: Diagnosis not present

## 2023-03-18 DIAGNOSIS — F1121 Opioid dependence, in remission: Secondary | ICD-10-CM | POA: Diagnosis not present

## 2023-03-18 DIAGNOSIS — F900 Attention-deficit hyperactivity disorder, predominantly inattentive type: Secondary | ICD-10-CM | POA: Diagnosis not present

## 2023-03-27 DIAGNOSIS — F1121 Opioid dependence, in remission: Secondary | ICD-10-CM | POA: Diagnosis not present

## 2023-03-27 DIAGNOSIS — F1021 Alcohol dependence, in remission: Secondary | ICD-10-CM | POA: Diagnosis not present

## 2023-03-27 DIAGNOSIS — F900 Attention-deficit hyperactivity disorder, predominantly inattentive type: Secondary | ICD-10-CM | POA: Diagnosis not present

## 2023-03-31 DIAGNOSIS — F1021 Alcohol dependence, in remission: Secondary | ICD-10-CM | POA: Diagnosis not present

## 2023-03-31 DIAGNOSIS — F1121 Opioid dependence, in remission: Secondary | ICD-10-CM | POA: Diagnosis not present

## 2023-03-31 DIAGNOSIS — F900 Attention-deficit hyperactivity disorder, predominantly inattentive type: Secondary | ICD-10-CM | POA: Diagnosis not present

## 2023-04-01 DIAGNOSIS — F1021 Alcohol dependence, in remission: Secondary | ICD-10-CM | POA: Diagnosis not present

## 2023-04-01 DIAGNOSIS — F900 Attention-deficit hyperactivity disorder, predominantly inattentive type: Secondary | ICD-10-CM | POA: Diagnosis not present

## 2023-04-01 DIAGNOSIS — F902 Attention-deficit hyperactivity disorder, combined type: Secondary | ICD-10-CM | POA: Diagnosis not present

## 2023-04-01 DIAGNOSIS — F1121 Opioid dependence, in remission: Secondary | ICD-10-CM | POA: Diagnosis not present

## 2023-04-01 DIAGNOSIS — R41840 Attention and concentration deficit: Secondary | ICD-10-CM | POA: Diagnosis not present

## 2023-04-01 DIAGNOSIS — Z5181 Encounter for therapeutic drug level monitoring: Secondary | ICD-10-CM | POA: Diagnosis not present

## 2023-04-06 DIAGNOSIS — F900 Attention-deficit hyperactivity disorder, predominantly inattentive type: Secondary | ICD-10-CM | POA: Diagnosis not present

## 2023-04-07 DIAGNOSIS — F902 Attention-deficit hyperactivity disorder, combined type: Secondary | ICD-10-CM | POA: Diagnosis not present

## 2023-04-14 DIAGNOSIS — F331 Major depressive disorder, recurrent, moderate: Secondary | ICD-10-CM | POA: Diagnosis not present

## 2023-04-14 DIAGNOSIS — F1021 Alcohol dependence, in remission: Secondary | ICD-10-CM | POA: Diagnosis not present

## 2023-04-23 DIAGNOSIS — F1021 Alcohol dependence, in remission: Secondary | ICD-10-CM | POA: Diagnosis not present

## 2023-04-23 DIAGNOSIS — F331 Major depressive disorder, recurrent, moderate: Secondary | ICD-10-CM | POA: Diagnosis not present

## 2023-04-30 DIAGNOSIS — F331 Major depressive disorder, recurrent, moderate: Secondary | ICD-10-CM | POA: Diagnosis not present

## 2023-05-07 DIAGNOSIS — F331 Major depressive disorder, recurrent, moderate: Secondary | ICD-10-CM | POA: Diagnosis not present

## 2023-05-14 DIAGNOSIS — F331 Major depressive disorder, recurrent, moderate: Secondary | ICD-10-CM | POA: Diagnosis not present

## 2023-05-21 DIAGNOSIS — F331 Major depressive disorder, recurrent, moderate: Secondary | ICD-10-CM | POA: Diagnosis not present

## 2023-05-25 DIAGNOSIS — F331 Major depressive disorder, recurrent, moderate: Secondary | ICD-10-CM | POA: Diagnosis not present

## 2023-06-02 DIAGNOSIS — F331 Major depressive disorder, recurrent, moderate: Secondary | ICD-10-CM | POA: Diagnosis not present

## 2023-06-09 DIAGNOSIS — F331 Major depressive disorder, recurrent, moderate: Secondary | ICD-10-CM | POA: Diagnosis not present

## 2023-06-15 DIAGNOSIS — H53143 Visual discomfort, bilateral: Secondary | ICD-10-CM | POA: Diagnosis not present

## 2023-06-15 DIAGNOSIS — H2513 Age-related nuclear cataract, bilateral: Secondary | ICD-10-CM | POA: Diagnosis not present

## 2023-06-16 DIAGNOSIS — F331 Major depressive disorder, recurrent, moderate: Secondary | ICD-10-CM | POA: Diagnosis not present

## 2023-06-25 DIAGNOSIS — E1136 Type 2 diabetes mellitus with diabetic cataract: Secondary | ICD-10-CM | POA: Diagnosis not present

## 2023-06-25 DIAGNOSIS — R140 Abdominal distension (gaseous): Secondary | ICD-10-CM | POA: Diagnosis not present

## 2023-06-30 DIAGNOSIS — F331 Major depressive disorder, recurrent, moderate: Secondary | ICD-10-CM | POA: Diagnosis not present

## 2023-07-07 DIAGNOSIS — F331 Major depressive disorder, recurrent, moderate: Secondary | ICD-10-CM | POA: Diagnosis not present

## 2023-07-08 DIAGNOSIS — F1021 Alcohol dependence, in remission: Secondary | ICD-10-CM | POA: Diagnosis not present

## 2023-07-08 DIAGNOSIS — F1121 Opioid dependence, in remission: Secondary | ICD-10-CM | POA: Diagnosis not present

## 2023-07-08 DIAGNOSIS — F900 Attention-deficit hyperactivity disorder, predominantly inattentive type: Secondary | ICD-10-CM | POA: Diagnosis not present

## 2023-07-13 DIAGNOSIS — F331 Major depressive disorder, recurrent, moderate: Secondary | ICD-10-CM | POA: Diagnosis not present

## 2023-07-13 DIAGNOSIS — F900 Attention-deficit hyperactivity disorder, predominantly inattentive type: Secondary | ICD-10-CM | POA: Diagnosis not present

## 2023-07-20 DIAGNOSIS — F331 Major depressive disorder, recurrent, moderate: Secondary | ICD-10-CM | POA: Diagnosis not present

## 2023-07-20 DIAGNOSIS — F900 Attention-deficit hyperactivity disorder, predominantly inattentive type: Secondary | ICD-10-CM | POA: Diagnosis not present

## 2023-07-30 DIAGNOSIS — F900 Attention-deficit hyperactivity disorder, predominantly inattentive type: Secondary | ICD-10-CM | POA: Diagnosis not present

## 2023-07-30 DIAGNOSIS — F331 Major depressive disorder, recurrent, moderate: Secondary | ICD-10-CM | POA: Diagnosis not present

## 2023-08-03 DIAGNOSIS — F1021 Alcohol dependence, in remission: Secondary | ICD-10-CM | POA: Diagnosis not present

## 2023-08-03 DIAGNOSIS — F900 Attention-deficit hyperactivity disorder, predominantly inattentive type: Secondary | ICD-10-CM | POA: Diagnosis not present

## 2023-08-03 DIAGNOSIS — F331 Major depressive disorder, recurrent, moderate: Secondary | ICD-10-CM | POA: Diagnosis not present

## 2023-08-05 DIAGNOSIS — F900 Attention-deficit hyperactivity disorder, predominantly inattentive type: Secondary | ICD-10-CM | POA: Diagnosis not present

## 2023-08-05 DIAGNOSIS — F331 Major depressive disorder, recurrent, moderate: Secondary | ICD-10-CM | POA: Diagnosis not present

## 2023-08-13 DIAGNOSIS — F331 Major depressive disorder, recurrent, moderate: Secondary | ICD-10-CM | POA: Diagnosis not present

## 2023-08-13 DIAGNOSIS — F902 Attention-deficit hyperactivity disorder, combined type: Secondary | ICD-10-CM | POA: Diagnosis not present

## 2023-08-19 DIAGNOSIS — F331 Major depressive disorder, recurrent, moderate: Secondary | ICD-10-CM | POA: Diagnosis not present

## 2023-08-19 DIAGNOSIS — F900 Attention-deficit hyperactivity disorder, predominantly inattentive type: Secondary | ICD-10-CM | POA: Diagnosis not present

## 2023-09-09 DIAGNOSIS — F331 Major depressive disorder, recurrent, moderate: Secondary | ICD-10-CM | POA: Diagnosis not present

## 2023-09-09 DIAGNOSIS — F900 Attention-deficit hyperactivity disorder, predominantly inattentive type: Secondary | ICD-10-CM | POA: Diagnosis not present

## 2023-09-14 DIAGNOSIS — F1021 Alcohol dependence, in remission: Secondary | ICD-10-CM | POA: Diagnosis not present

## 2023-09-14 DIAGNOSIS — F1121 Opioid dependence, in remission: Secondary | ICD-10-CM | POA: Diagnosis not present

## 2023-09-14 DIAGNOSIS — F900 Attention-deficit hyperactivity disorder, predominantly inattentive type: Secondary | ICD-10-CM | POA: Diagnosis not present

## 2023-09-17 DIAGNOSIS — Z03818 Encounter for observation for suspected exposure to other biological agents ruled out: Secondary | ICD-10-CM | POA: Diagnosis not present

## 2023-09-17 DIAGNOSIS — R42 Dizziness and giddiness: Secondary | ICD-10-CM | POA: Diagnosis not present

## 2023-09-17 DIAGNOSIS — J069 Acute upper respiratory infection, unspecified: Secondary | ICD-10-CM | POA: Diagnosis not present

## 2023-09-18 DIAGNOSIS — F331 Major depressive disorder, recurrent, moderate: Secondary | ICD-10-CM | POA: Diagnosis not present

## 2023-09-22 DIAGNOSIS — G4700 Insomnia, unspecified: Secondary | ICD-10-CM | POA: Diagnosis not present

## 2023-09-22 DIAGNOSIS — G4719 Other hypersomnia: Secondary | ICD-10-CM | POA: Diagnosis not present

## 2023-09-22 DIAGNOSIS — F324 Major depressive disorder, single episode, in partial remission: Secondary | ICD-10-CM | POA: Diagnosis not present

## 2023-09-22 DIAGNOSIS — F909 Attention-deficit hyperactivity disorder, unspecified type: Secondary | ICD-10-CM | POA: Diagnosis not present

## 2023-09-22 DIAGNOSIS — F1021 Alcohol dependence, in remission: Secondary | ICD-10-CM | POA: Diagnosis not present

## 2023-09-22 DIAGNOSIS — R0683 Snoring: Secondary | ICD-10-CM | POA: Diagnosis not present

## 2023-09-23 DIAGNOSIS — F331 Major depressive disorder, recurrent, moderate: Secondary | ICD-10-CM | POA: Diagnosis not present

## 2023-10-13 DIAGNOSIS — G4733 Obstructive sleep apnea (adult) (pediatric): Secondary | ICD-10-CM | POA: Diagnosis not present

## 2023-10-15 DIAGNOSIS — H52223 Regular astigmatism, bilateral: Secondary | ICD-10-CM | POA: Diagnosis not present

## 2023-10-15 DIAGNOSIS — E119 Type 2 diabetes mellitus without complications: Secondary | ICD-10-CM | POA: Diagnosis not present

## 2023-10-15 DIAGNOSIS — H53143 Visual discomfort, bilateral: Secondary | ICD-10-CM | POA: Diagnosis not present

## 2023-10-15 DIAGNOSIS — D3131 Benign neoplasm of right choroid: Secondary | ICD-10-CM | POA: Diagnosis not present

## 2023-10-19 DIAGNOSIS — N400 Enlarged prostate without lower urinary tract symptoms: Secondary | ICD-10-CM | POA: Diagnosis not present

## 2023-10-19 DIAGNOSIS — E1136 Type 2 diabetes mellitus with diabetic cataract: Secondary | ICD-10-CM | POA: Diagnosis not present

## 2023-10-19 DIAGNOSIS — G4733 Obstructive sleep apnea (adult) (pediatric): Secondary | ICD-10-CM | POA: Diagnosis not present

## 2023-10-19 DIAGNOSIS — F331 Major depressive disorder, recurrent, moderate: Secondary | ICD-10-CM | POA: Diagnosis not present

## 2023-10-19 DIAGNOSIS — F909 Attention-deficit hyperactivity disorder, unspecified type: Secondary | ICD-10-CM | POA: Diagnosis not present

## 2023-10-19 DIAGNOSIS — G4719 Other hypersomnia: Secondary | ICD-10-CM | POA: Diagnosis not present

## 2023-10-19 DIAGNOSIS — G4700 Insomnia, unspecified: Secondary | ICD-10-CM | POA: Diagnosis not present

## 2023-11-03 DIAGNOSIS — F331 Major depressive disorder, recurrent, moderate: Secondary | ICD-10-CM | POA: Diagnosis not present

## 2023-11-04 DIAGNOSIS — F902 Attention-deficit hyperactivity disorder, combined type: Secondary | ICD-10-CM | POA: Diagnosis not present

## 2023-11-04 DIAGNOSIS — Z5181 Encounter for therapeutic drug level monitoring: Secondary | ICD-10-CM | POA: Diagnosis not present

## 2023-11-04 DIAGNOSIS — F1021 Alcohol dependence, in remission: Secondary | ICD-10-CM | POA: Diagnosis not present

## 2023-11-04 DIAGNOSIS — R41840 Attention and concentration deficit: Secondary | ICD-10-CM | POA: Diagnosis not present

## 2023-11-04 DIAGNOSIS — F900 Attention-deficit hyperactivity disorder, predominantly inattentive type: Secondary | ICD-10-CM | POA: Diagnosis not present

## 2023-11-04 DIAGNOSIS — F1121 Opioid dependence, in remission: Secondary | ICD-10-CM | POA: Diagnosis not present

## 2023-11-11 DIAGNOSIS — F1021 Alcohol dependence, in remission: Secondary | ICD-10-CM | POA: Diagnosis not present

## 2023-11-11 DIAGNOSIS — F900 Attention-deficit hyperactivity disorder, predominantly inattentive type: Secondary | ICD-10-CM | POA: Diagnosis not present

## 2023-11-18 DIAGNOSIS — F1021 Alcohol dependence, in remission: Secondary | ICD-10-CM | POA: Diagnosis not present

## 2023-11-18 DIAGNOSIS — F900 Attention-deficit hyperactivity disorder, predominantly inattentive type: Secondary | ICD-10-CM | POA: Diagnosis not present

## 2023-11-27 DIAGNOSIS — F1021 Alcohol dependence, in remission: Secondary | ICD-10-CM | POA: Diagnosis not present

## 2023-11-27 DIAGNOSIS — F900 Attention-deficit hyperactivity disorder, predominantly inattentive type: Secondary | ICD-10-CM | POA: Diagnosis not present

## 2023-12-16 DIAGNOSIS — F900 Attention-deficit hyperactivity disorder, predominantly inattentive type: Secondary | ICD-10-CM | POA: Diagnosis not present

## 2023-12-16 DIAGNOSIS — F1121 Opioid dependence, in remission: Secondary | ICD-10-CM | POA: Diagnosis not present

## 2023-12-23 DIAGNOSIS — F900 Attention-deficit hyperactivity disorder, predominantly inattentive type: Secondary | ICD-10-CM | POA: Diagnosis not present

## 2023-12-23 DIAGNOSIS — F1021 Alcohol dependence, in remission: Secondary | ICD-10-CM | POA: Diagnosis not present

## 2023-12-28 DIAGNOSIS — F1021 Alcohol dependence, in remission: Secondary | ICD-10-CM | POA: Diagnosis not present

## 2023-12-28 DIAGNOSIS — F1121 Opioid dependence, in remission: Secondary | ICD-10-CM | POA: Diagnosis not present

## 2023-12-28 DIAGNOSIS — F900 Attention-deficit hyperactivity disorder, predominantly inattentive type: Secondary | ICD-10-CM | POA: Diagnosis not present

## 2023-12-30 DIAGNOSIS — F1021 Alcohol dependence, in remission: Secondary | ICD-10-CM | POA: Diagnosis not present

## 2023-12-30 DIAGNOSIS — F1121 Opioid dependence, in remission: Secondary | ICD-10-CM | POA: Diagnosis not present

## 2023-12-30 DIAGNOSIS — H57813 Brow ptosis, bilateral: Secondary | ICD-10-CM | POA: Diagnosis not present

## 2023-12-30 DIAGNOSIS — H02834 Dermatochalasis of left upper eyelid: Secondary | ICD-10-CM | POA: Diagnosis not present

## 2023-12-30 DIAGNOSIS — F900 Attention-deficit hyperactivity disorder, predominantly inattentive type: Secondary | ICD-10-CM | POA: Diagnosis not present

## 2023-12-30 DIAGNOSIS — H02831 Dermatochalasis of right upper eyelid: Secondary | ICD-10-CM | POA: Diagnosis not present

## 2023-12-30 DIAGNOSIS — H0279 Other degenerative disorders of eyelid and periocular area: Secondary | ICD-10-CM | POA: Diagnosis not present

## 2024-01-06 DIAGNOSIS — F1121 Opioid dependence, in remission: Secondary | ICD-10-CM | POA: Diagnosis not present

## 2024-01-06 DIAGNOSIS — F1021 Alcohol dependence, in remission: Secondary | ICD-10-CM | POA: Diagnosis not present

## 2024-01-06 DIAGNOSIS — F900 Attention-deficit hyperactivity disorder, predominantly inattentive type: Secondary | ICD-10-CM | POA: Diagnosis not present

## 2024-01-12 DIAGNOSIS — L820 Inflamed seborrheic keratosis: Secondary | ICD-10-CM | POA: Diagnosis not present

## 2024-01-12 DIAGNOSIS — Z1283 Encounter for screening for malignant neoplasm of skin: Secondary | ICD-10-CM | POA: Diagnosis not present

## 2024-01-12 DIAGNOSIS — L821 Other seborrheic keratosis: Secondary | ICD-10-CM | POA: Diagnosis not present

## 2024-01-12 DIAGNOSIS — D225 Melanocytic nevi of trunk: Secondary | ICD-10-CM | POA: Diagnosis not present

## 2024-01-12 DIAGNOSIS — L720 Epidermal cyst: Secondary | ICD-10-CM | POA: Diagnosis not present

## 2024-01-13 DIAGNOSIS — F900 Attention-deficit hyperactivity disorder, predominantly inattentive type: Secondary | ICD-10-CM | POA: Diagnosis not present

## 2024-01-13 DIAGNOSIS — F1021 Alcohol dependence, in remission: Secondary | ICD-10-CM | POA: Diagnosis not present

## 2024-01-19 DIAGNOSIS — F900 Attention-deficit hyperactivity disorder, predominantly inattentive type: Secondary | ICD-10-CM | POA: Diagnosis not present

## 2024-01-19 DIAGNOSIS — F1021 Alcohol dependence, in remission: Secondary | ICD-10-CM | POA: Diagnosis not present

## 2024-02-03 DIAGNOSIS — F1021 Alcohol dependence, in remission: Secondary | ICD-10-CM | POA: Diagnosis not present

## 2024-02-03 DIAGNOSIS — F900 Attention-deficit hyperactivity disorder, predominantly inattentive type: Secondary | ICD-10-CM | POA: Diagnosis not present

## 2024-02-08 DIAGNOSIS — Z5181 Encounter for therapeutic drug level monitoring: Secondary | ICD-10-CM | POA: Diagnosis not present

## 2024-02-08 DIAGNOSIS — F900 Attention-deficit hyperactivity disorder, predominantly inattentive type: Secondary | ICD-10-CM | POA: Diagnosis not present

## 2024-02-08 DIAGNOSIS — F1021 Alcohol dependence, in remission: Secondary | ICD-10-CM | POA: Diagnosis not present

## 2024-02-09 DIAGNOSIS — F331 Major depressive disorder, recurrent, moderate: Secondary | ICD-10-CM | POA: Diagnosis not present

## 2024-02-09 DIAGNOSIS — F900 Attention-deficit hyperactivity disorder, predominantly inattentive type: Secondary | ICD-10-CM | POA: Diagnosis not present

## 2024-02-12 ENCOUNTER — Encounter: Payer: Self-pay | Admitting: Cardiovascular Disease

## 2024-02-12 ENCOUNTER — Ambulatory Visit: Payer: Self-pay | Attending: Cardiovascular Disease | Admitting: Cardiovascular Disease

## 2024-02-12 VITALS — BP 114/68 | HR 75 | Ht 70.0 in | Wt 188.0 lb

## 2024-02-12 DIAGNOSIS — Z01818 Encounter for other preprocedural examination: Secondary | ICD-10-CM | POA: Diagnosis not present

## 2024-02-12 DIAGNOSIS — I10 Essential (primary) hypertension: Secondary | ICD-10-CM

## 2024-02-12 DIAGNOSIS — E1129 Type 2 diabetes mellitus with other diabetic kidney complication: Secondary | ICD-10-CM | POA: Diagnosis not present

## 2024-02-12 DIAGNOSIS — R072 Precordial pain: Secondary | ICD-10-CM

## 2024-02-12 DIAGNOSIS — E782 Mixed hyperlipidemia: Secondary | ICD-10-CM | POA: Diagnosis not present

## 2024-02-12 MED ORDER — METOPROLOL TARTRATE 50 MG PO TABS: 50.0000 mg | ORAL_TABLET | Freq: Once | ORAL | 0 refills | Status: AC

## 2024-02-12 NOTE — Patient Instructions (Addendum)
 Medication Instructions:  Your physician recommends that you continue on your current medications as directed. Please refer to the Current Medication list given to you today.  *If you need a refill on your cardiac medications before your next appointment, please call your pharmacy*  Lab Work: Today: BMET If you have labs (blood work) drawn today and your tests are completely normal, you will receive your results only by: MyChart Message (if you have MyChart) OR A paper copy in the mail If you have any lab test that is abnormal or we need to change your treatment, we will call you to review the results.  Testing/Procedures: Your physician has requested that you have cardiac CT. Cardiac computed tomography (CT) is a painless test that uses an x-ray machine to take clear, detailed pictures of your heart. For further information please visit [email or web address] Please follow instruction sheet as given.  Follow-Up: At [facility], you and your health needs are our priority.  As part of our continuing mission to provide you with exceptional heart care, our providers are all part of one team.  This team includes your primary Cardiologist (physician) and Advanced Practice Providers or APPs (Physician Assistants and Nurse Practitioners) who all work together to provide you with the care you need, when you need it.  Your next appointment:   1 year(s)  Provider:   Jerel Balding, MD   We recommend signing up for the patient portal called MyChart.  Sign up information is provided on this After Visit Summary.  MyChart is used to connect with patients for Virtual Visits (Telemedicine).  Patients are able to view lab/test results, encounter notes, upcoming appointments, etc.  Non-urgent messages can be sent to your provider as well.   To learn more about what you can do with MyChart, go to [email or web address].   Other Instructions   Your cardiac CT will be scheduled at one of the  below locations:   [facility] [address] [place], [place] [address] [phone]   Please follow these instructions carefully (unless otherwise directed):  An IV will be required for this test and Nitroglycerin will be given.  Hold all erectile dysfunction medications at least 3 days (72 hrs) prior to test. (Ie viagra, cialis, sildenafil, tadalafil, etc)   On the Night Before the Test: Be sure to Drink plenty of water . Do not consume any caffeinated/decaffeinated beverages or chocolate 12 hours prior to your test. Do not take any antihistamines 12 hours prior to your test.  On the Day of the Test: Drink plenty of water  until 1 hour prior to the test. Do not eat any food 1 hour prior to test. You may take your regular medications prior to the test.  Take metoprolol (Lopressor) 50 mg two hours prior to test.      After the Test: Drink plenty of water . After receiving IV contrast, you may experience a mild flushed feeling. This is normal. On occasion, you may experience a mild rash up to 24 hours after the test. This is not dangerous. If this occurs, you can take Benadryl  25 mg and increase your fluid intake. If you experience trouble breathing, this can be serious. If it is severe call 911 IMMEDIATELY. If it is mild, please call our office. If you take any of these medications: Glipizide/Metformin, Avandament, Glucavance, please do not take 48 hours after completing test unless otherwise instructed.  We will call to schedule your test 2-4 weeks out understanding that some insurance  companies will need an authorization prior to the service being performed.   For more information and frequently asked questions, please visit our website : [email or web address]  For non-scheduling related questions, please contact the cardiac imaging nurse navigator should you have any questions/concerns: Cardiac Imaging Nurse Navigators Direct Office Dial:  [phone]   For scheduling needs, including cancellations and rescheduling, please call [place], [phone].

## 2024-02-12 NOTE — Progress Notes (Signed)
 Cardiology Office Note   Date:  02/12/2024  ID:  Jamarrius, Salay March 04, 1956, MRN [id number] PCP: Ransom Other, MD   HeartCare Providers Cardiologist:  Jerel Balding, MD     History of Present Illness MALCOMB GANGEMI is a 68 y.o. male with a history of hypertension, type 2 diabetes mellitus, family history of premature death from CAD in his brother, ADHD, history of depression, BPH returning for his first cardiology evaluation since 2014.  He had a normal nuclear stress test in 2014, performed for ECG abnormalities.    His mother Dariyon Urquilla was my patient for many years.  She had CAD and atrial fibrillation.  He has a brother that died at age 9 from heart disease.  Heart disease has been the cause of death in most of his family members.  He has had some difficulty with occasional palpitations and unexplained anterior chest discomfort.  This is not exertional and mostly occurs at rest during periods of increased emotion.  He does not engage in regular physical exercise.  He reports that his blood pressure has been bouncing all over the place, but he has not really been checking it in a systematic fashion when he is fully relaxed.  Today his blood pressure and heart rate are both normal.  He had more problems with orthostatic dizziness when taking tamsulosin, things have improved after he switched to alfuzosin.  Diabetes control has been poor but recently has improved substantially, in parallel with weight loss on Mounjaro (also takes Jardiance and metformin).  Recent A1c performed about 3 months ago was down to 7.1%.  Lipid parameters have also improved recently although he still has mild hypertriglyceridemia.  HDL is borderline low at 44.  On statin therapy his most recent LDL was 81.  He had an episode of acute kidney injury in 2020 in the setting of dehydration due to nausea and vomiting.  His creatinine actually peaked at 6.8.  Thankfully he recovered from that fully and his  most recent creatinine was normal at 0.8.    Over the last several years he has had a lot of issues related to mental health that are finally falling into place.  He quit drinking alcohol  completely about 4 years ago.  He was in rehab.  He has been dealing with a variety of changes in his depression medicine but things are now closer to even keel.   Studies Reviewed EKG Interpretation Date/Time:  Friday February 12 2024 11:18:35 EDT Ventricular Rate:  75 PR Interval:  204 QRS Duration:  142 QT Interval:  398 QTC Calculation: 444 R Axis:   9  Text Interpretation: Normal sinus rhythm Right bundle branch block When compared with ECG of 29-Sep-2018 06:21, Right bundle branch block is now Present Confirmed by Charissa Knowles [id number]) on 02/12/2024 11:19:59 AM  Nuclear stress test 2014 Overall Impression: Normal stress nuclear study. and Low risk stress nuclear study with mild diaphragmatic attenuation..   Labs 05/11/2023 Cholesterol 159, HDL 44, LDL 81, triglycerides 198 ALT 22, TSH 1.870  Labs 09/17/2023 Hemoglobin 13.8, potassium 3.9, creatinine 0.8  Labs 11/10/2023 Hemoglobin A1c 7.1% Risk Assessment/Calculations           Physical Exam VS:  BP 114/68 (BP Location: Left Arm, Patient Position: Sitting, Cuff Size: Normal)   Pulse 75   Ht 5' 10 (1.778 m)   Wt 188 lb (85.3 kg)   SpO2 97%   BMI 26.98 kg/m        Wt Readings  from Last 3 Encounters:  02/12/24 188 lb (85.3 kg)  10/07/18 185 lb 14.4 oz (84.3 kg)  02/19/16 205 lb (93 kg)    GEN: Well nourished, well developed in no acute distress NECK: No JVD; No carotid bruits CARDIAC: Normal S1, widely split S2, RRR, no murmurs, rubs, gallops RESPIRATORY:  Clear to auscultation without rales, wheezing or rhonchi  ABDOMEN: Soft, non-tender, non-distended EXTREMITIES:  No edema; No deformity   ASSESSMENT AND PLAN Chest discomfort: Strong family history of premature coronary artery disease including a brother who died at age 61;  type 2 diabetes mellitus; hypercholesterolemia and male gender are all his significant risk factors.  Scheduled for coronary CT angiography.   Palpitations: These are very brief and his palpitations sound more like to be isolated PACs or PVCs. HLP: Depending on whether or not we identify significant atherosclerosis his LDL cholesterol may be acceptable.  Low enough for patient with diabetes mellitus, but if he has a lot of atherosclerotic disease would plan to treat more aggressively to achieve LDL less than 70. DM: Fair glycemic control almost in target range with hemoglobin A1c 7.1%.  He has lost substantial weight on treatment with Mounjaro. History of depression/ADHD/alcohol  dependence: he has been sober for several years now and is to be congratulated.  Depression seems to be adequately controlled as well.       Dispo:  Patient Instructions  Medication Instructions:  Your physician recommends that you continue on your current medications as directed. Please refer to the Current Medication list given to you today.  *If you need a refill on your cardiac medications before your next appointment, please call your pharmacy*  Lab Work: Today: BMET If you have labs (blood work) drawn today and your tests are completely normal, you will receive your results only by: MyChart Message (if you have MyChart) OR A paper copy in the mail If you have any lab test that is abnormal or we need to change your treatment, we will call you to review the results.  Testing/Procedures: Your physician has requested that you have cardiac CT. Cardiac computed tomography (CT) is a painless test that uses an x-ray machine to take clear, detailed pictures of your heart. For further information please visit [email or web address] Please follow instruction sheet as given.  Follow-Up: At [facility], you and your health needs are our priority.  As part of our continuing mission to provide you with exceptional  heart care, our providers are all part of one team.  This team includes your primary Cardiologist (physician) and Advanced Practice Providers or APPs (Physician Assistants and Nurse Practitioners) who all work together to provide you with the care you need, when you need it.  Your next appointment:   1 year(s)  Provider:   Jerel Balding, MD   We recommend signing up for the patient portal called MyChart.  Sign up information is provided on this After Visit Summary.  MyChart is used to connect with patients for Virtual Visits (Telemedicine).  Patients are able to view lab/test results, encounter notes, upcoming appointments, etc.  Non-urgent messages can be sent to your provider as well.   To learn more about what you can do with MyChart, go to [email or web address].   Other Instructions   Your cardiac CT will be scheduled at one of the below locations:   [facility] [address] [place], [place] [address] [phone]   Please follow these instructions carefully (unless otherwise directed):  An IV  will be required for this test and Nitroglycerin will be given.  Hold all erectile dysfunction medications at least 3 days (72 hrs) prior to test. (Ie viagra, cialis, sildenafil, tadalafil, etc)   On the Night Before the Test: Be sure to Drink plenty of water . Do not consume any caffeinated/decaffeinated beverages or chocolate 12 hours prior to your test. Do not take any antihistamines 12 hours prior to your test.  On the Day of the Test: Drink plenty of water  until 1 hour prior to the test. Do not eat any food 1 hour prior to test. You may take your regular medications prior to the test.  Take metoprolol (Lopressor) 50 mg two hours prior to test.      After the Test: Drink plenty of water . After receiving IV contrast, you may experience a mild flushed feeling. This is normal. On occasion, you may experience a mild rash up to 24 hours after the  test. This is not dangerous. If this occurs, you can take Benadryl  25 mg and increase your fluid intake. If you experience trouble breathing, this can be serious. If it is severe call 911 IMMEDIATELY. If it is mild, please call our office. If you take any of these medications: Glipizide/Metformin, Avandament, Glucavance, please do not take 48 hours after completing test unless otherwise instructed.  We will call to schedule your test 2-4 weeks out understanding that some insurance companies will need an authorization prior to the service being performed.   For more information and frequently asked questions, please visit our website : [email or web address]  For non-scheduling related questions, please contact the cardiac imaging nurse navigator should you have any questions/concerns: Cardiac Imaging Nurse Navigators Direct Office Dial: [phone]   For scheduling needs, including cancellations and rescheduling, please call [place], [phone].    Signed, Jerel Balding, MD

## 2024-02-13 ENCOUNTER — Ambulatory Visit: Payer: Self-pay | Admitting: Cardiovascular Disease

## 2024-02-13 LAB — BASIC METABOLIC PANEL WITH GFR
BUN/Creatinine Ratio: 18 (ref 10–24)
BUN: 17 mg/dL (ref 8–27)
CO2: 21 mmol/L (ref 20–29)
Calcium: 9.9 mg/dL (ref 8.6–10.2)
Chloride: 101 mmol/L (ref 96–106)
Creatinine, Ser: 0.93 mg/dL (ref 0.76–1.27)
Glucose: 98 mg/dL (ref 70–99)
Potassium: 4.4 mmol/L (ref 3.5–5.2)
Sodium: 139 mmol/L (ref 134–144)
eGFR: 90 mL/min/1.73 (ref >59)

## 2024-02-15 ENCOUNTER — Encounter (HOSPITAL_COMMUNITY): Payer: Self-pay

## 2024-02-17 ENCOUNTER — Telehealth (HOSPITAL_COMMUNITY): Payer: Self-pay | Admitting: Emergency Medicine

## 2024-02-17 DIAGNOSIS — F900 Attention-deficit hyperactivity disorder, predominantly inattentive type: Secondary | ICD-10-CM | POA: Diagnosis not present

## 2024-02-17 DIAGNOSIS — F331 Major depressive disorder, recurrent, moderate: Secondary | ICD-10-CM | POA: Diagnosis not present

## 2024-02-17 NOTE — Telephone Encounter (Signed)
 Reaching out to patient to offer assistance regarding upcoming cardiac imaging study; pt verbalizes understanding of appt date/time, parking situation and where to check in, pre-test NPO status and medications ordered, and verified current allergies; name and call back number provided for further questions should they arise Rockwell Alexandria RN Navigator Cardiac Imaging [facility] [phone] office [phone] cell

## 2024-02-18 ENCOUNTER — Ambulatory Visit (HOSPITAL_COMMUNITY)
Admission: RE | Admit: 2024-02-18 | Discharge: 2024-02-18 | Disposition: A | Discharge status: Home / Self Care | Source: Ambulatory Visit | Attending: Cardiology | Admitting: Cardiology

## 2024-02-18 DIAGNOSIS — F331 Major depressive disorder, recurrent, moderate: Secondary | ICD-10-CM | POA: Diagnosis not present

## 2024-02-18 DIAGNOSIS — N400 Enlarged prostate without lower urinary tract symptoms: Secondary | ICD-10-CM | POA: Diagnosis not present

## 2024-02-18 DIAGNOSIS — R072 Precordial pain: Secondary | ICD-10-CM | POA: Insufficient documentation

## 2024-02-18 DIAGNOSIS — E7800 Pure hypercholesterolemia, unspecified: Secondary | ICD-10-CM | POA: Diagnosis not present

## 2024-02-18 DIAGNOSIS — I10 Essential (primary) hypertension: Secondary | ICD-10-CM | POA: Diagnosis not present

## 2024-02-18 MED ORDER — IOHEXOL 350 MG/ML SOLN
100.0000 mL | Freq: Once | INTRAVENOUS | Status: AC | PRN
  Administered 2024-02-18: 100 mL via INTRAVENOUS

## 2024-02-18 MED ORDER — NITROGLYCERIN 0.4 MG SL SUBL
0.8000 mg | SUBLINGUAL_TABLET | Freq: Once | SUBLINGUAL | Status: AC
  Administered 2024-02-18: 0.8 mg via SUBLINGUAL

## 2024-02-23 DIAGNOSIS — F331 Major depressive disorder, recurrent, moderate: Secondary | ICD-10-CM | POA: Diagnosis not present

## 2024-02-23 DIAGNOSIS — F900 Attention-deficit hyperactivity disorder, predominantly inattentive type: Secondary | ICD-10-CM | POA: Diagnosis not present

## 2024-03-01 DIAGNOSIS — F331 Major depressive disorder, recurrent, moderate: Secondary | ICD-10-CM | POA: Diagnosis not present

## 2024-03-01 DIAGNOSIS — F900 Attention-deficit hyperactivity disorder, predominantly inattentive type: Secondary | ICD-10-CM | POA: Diagnosis not present

## 2024-03-08 DIAGNOSIS — F331 Major depressive disorder, recurrent, moderate: Secondary | ICD-10-CM | POA: Diagnosis not present

## 2024-03-08 DIAGNOSIS — F900 Attention-deficit hyperactivity disorder, predominantly inattentive type: Secondary | ICD-10-CM | POA: Diagnosis not present

## 2024-03-09 DIAGNOSIS — F900 Attention-deficit hyperactivity disorder, predominantly inattentive type: Secondary | ICD-10-CM | POA: Diagnosis not present

## 2024-03-09 DIAGNOSIS — F1021 Alcohol dependence, in remission: Secondary | ICD-10-CM | POA: Diagnosis not present

## 2024-03-15 DIAGNOSIS — F900 Attention-deficit hyperactivity disorder, predominantly inattentive type: Secondary | ICD-10-CM | POA: Diagnosis not present

## 2024-03-15 DIAGNOSIS — F1021 Alcohol dependence, in remission: Secondary | ICD-10-CM | POA: Diagnosis not present

## 2024-03-20 DIAGNOSIS — E7800 Pure hypercholesterolemia, unspecified: Secondary | ICD-10-CM | POA: Diagnosis not present

## 2024-03-20 DIAGNOSIS — I10 Essential (primary) hypertension: Secondary | ICD-10-CM | POA: Diagnosis not present

## 2024-03-20 DIAGNOSIS — F331 Major depressive disorder, recurrent, moderate: Secondary | ICD-10-CM | POA: Diagnosis not present

## 2024-03-20 DIAGNOSIS — N400 Enlarged prostate without lower urinary tract symptoms: Secondary | ICD-10-CM | POA: Diagnosis not present

## 2024-03-22 DIAGNOSIS — F900 Attention-deficit hyperactivity disorder, predominantly inattentive type: Secondary | ICD-10-CM | POA: Diagnosis not present

## 2024-03-22 DIAGNOSIS — F1021 Alcohol dependence, in remission: Secondary | ICD-10-CM | POA: Diagnosis not present

## 2024-03-29 DIAGNOSIS — F1021 Alcohol dependence, in remission: Secondary | ICD-10-CM | POA: Diagnosis not present

## 2024-03-29 DIAGNOSIS — F900 Attention-deficit hyperactivity disorder, predominantly inattentive type: Secondary | ICD-10-CM | POA: Diagnosis not present

## 2024-04-19 DIAGNOSIS — E7800 Pure hypercholesterolemia, unspecified: Secondary | ICD-10-CM | POA: Diagnosis not present

## 2024-04-19 DIAGNOSIS — N400 Enlarged prostate without lower urinary tract symptoms: Secondary | ICD-10-CM | POA: Diagnosis not present

## 2024-04-19 DIAGNOSIS — I10 Essential (primary) hypertension: Secondary | ICD-10-CM | POA: Diagnosis not present

## 2024-04-19 DIAGNOSIS — F900 Attention-deficit hyperactivity disorder, predominantly inattentive type: Secondary | ICD-10-CM | POA: Diagnosis not present

## 2024-04-19 DIAGNOSIS — F331 Major depressive disorder, recurrent, moderate: Secondary | ICD-10-CM | POA: Diagnosis not present

## 2024-04-20 DIAGNOSIS — F900 Attention-deficit hyperactivity disorder, predominantly inattentive type: Secondary | ICD-10-CM | POA: Diagnosis not present

## 2024-04-20 DIAGNOSIS — F1021 Alcohol dependence, in remission: Secondary | ICD-10-CM | POA: Diagnosis not present

## 2024-04-21 DIAGNOSIS — G4700 Insomnia, unspecified: Secondary | ICD-10-CM | POA: Diagnosis not present

## 2024-04-21 DIAGNOSIS — F3341 Major depressive disorder, recurrent, in partial remission: Secondary | ICD-10-CM | POA: Diagnosis not present

## 2024-04-21 DIAGNOSIS — F909 Attention-deficit hyperactivity disorder, unspecified type: Secondary | ICD-10-CM | POA: Diagnosis not present

## 2024-04-21 DIAGNOSIS — G4733 Obstructive sleep apnea (adult) (pediatric): Secondary | ICD-10-CM | POA: Diagnosis not present

## 2024-04-21 DIAGNOSIS — F1021 Alcohol dependence, in remission: Secondary | ICD-10-CM | POA: Diagnosis not present

## 2024-05-06 DIAGNOSIS — F332 Major depressive disorder, recurrent severe without psychotic features: Secondary | ICD-10-CM | POA: Diagnosis not present

## 2024-05-12 DIAGNOSIS — Z0000 Encounter for general adult medical examination without abnormal findings: Secondary | ICD-10-CM | POA: Diagnosis not present

## 2024-05-12 DIAGNOSIS — F341 Dysthymic disorder: Secondary | ICD-10-CM | POA: Diagnosis not present

## 2024-05-12 DIAGNOSIS — Z23 Encounter for immunization: Secondary | ICD-10-CM | POA: Diagnosis not present

## 2024-05-12 DIAGNOSIS — F900 Attention-deficit hyperactivity disorder, predominantly inattentive type: Secondary | ICD-10-CM | POA: Diagnosis not present

## 2024-05-18 DIAGNOSIS — G4733 Obstructive sleep apnea (adult) (pediatric): Secondary | ICD-10-CM | POA: Diagnosis not present

## 2024-05-19 DIAGNOSIS — F900 Attention-deficit hyperactivity disorder, predominantly inattentive type: Secondary | ICD-10-CM | POA: Diagnosis not present

## 2024-05-19 DIAGNOSIS — F341 Dysthymic disorder: Secondary | ICD-10-CM | POA: Diagnosis not present

## 2024-05-20 DIAGNOSIS — F331 Major depressive disorder, recurrent, moderate: Secondary | ICD-10-CM | POA: Diagnosis not present

## 2024-05-20 DIAGNOSIS — E7800 Pure hypercholesterolemia, unspecified: Secondary | ICD-10-CM | POA: Diagnosis not present

## 2024-05-20 DIAGNOSIS — I10 Essential (primary) hypertension: Secondary | ICD-10-CM | POA: Diagnosis not present

## 2024-05-20 DIAGNOSIS — N400 Enlarged prostate without lower urinary tract symptoms: Secondary | ICD-10-CM | POA: Diagnosis not present

## 2024-05-30 DIAGNOSIS — Z5181 Encounter for therapeutic drug level monitoring: Secondary | ICD-10-CM | POA: Diagnosis not present

## 2024-05-30 DIAGNOSIS — G4730 Sleep apnea, unspecified: Secondary | ICD-10-CM | POA: Diagnosis not present

## 2024-05-30 DIAGNOSIS — F1021 Alcohol dependence, in remission: Secondary | ICD-10-CM | POA: Diagnosis not present

## 2024-05-30 DIAGNOSIS — F900 Attention-deficit hyperactivity disorder, predominantly inattentive type: Secondary | ICD-10-CM | POA: Diagnosis not present

## 2024-06-01 DIAGNOSIS — F900 Attention-deficit hyperactivity disorder, predominantly inattentive type: Secondary | ICD-10-CM | POA: Diagnosis not present

## 2024-06-01 DIAGNOSIS — G4733 Obstructive sleep apnea (adult) (pediatric): Secondary | ICD-10-CM | POA: Diagnosis not present

## 2024-06-01 DIAGNOSIS — F1021 Alcohol dependence, in remission: Secondary | ICD-10-CM | POA: Diagnosis not present

## 2024-06-03 DIAGNOSIS — F332 Major depressive disorder, recurrent severe without psychotic features: Secondary | ICD-10-CM | POA: Diagnosis not present

## 2024-06-06 DIAGNOSIS — F332 Major depressive disorder, recurrent severe without psychotic features: Secondary | ICD-10-CM | POA: Diagnosis not present

## 2024-06-09 DIAGNOSIS — F332 Major depressive disorder, recurrent severe without psychotic features: Secondary | ICD-10-CM | POA: Diagnosis not present

## 2024-06-13 DIAGNOSIS — F332 Major depressive disorder, recurrent severe without psychotic features: Secondary | ICD-10-CM | POA: Diagnosis not present

## 2024-06-14 DIAGNOSIS — F900 Attention-deficit hyperactivity disorder, predominantly inattentive type: Secondary | ICD-10-CM | POA: Diagnosis not present

## 2024-06-15 DIAGNOSIS — F332 Major depressive disorder, recurrent severe without psychotic features: Secondary | ICD-10-CM | POA: Diagnosis not present

## 2024-06-19 DIAGNOSIS — F331 Major depressive disorder, recurrent, moderate: Secondary | ICD-10-CM | POA: Diagnosis not present

## 2024-06-19 DIAGNOSIS — E7800 Pure hypercholesterolemia, unspecified: Secondary | ICD-10-CM | POA: Diagnosis not present

## 2024-06-19 DIAGNOSIS — I10 Essential (primary) hypertension: Secondary | ICD-10-CM | POA: Diagnosis not present

## 2024-06-19 DIAGNOSIS — N400 Enlarged prostate without lower urinary tract symptoms: Secondary | ICD-10-CM | POA: Diagnosis not present

## 2024-06-20 DIAGNOSIS — F332 Major depressive disorder, recurrent severe without psychotic features: Secondary | ICD-10-CM | POA: Diagnosis not present

## 2024-06-21 DIAGNOSIS — N3941 Urge incontinence: Secondary | ICD-10-CM | POA: Diagnosis not present

## 2024-06-21 DIAGNOSIS — N401 Enlarged prostate with lower urinary tract symptoms: Secondary | ICD-10-CM | POA: Diagnosis not present

## 2024-06-21 DIAGNOSIS — R3912 Poor urinary stream: Secondary | ICD-10-CM | POA: Diagnosis not present

## 2024-06-24 DIAGNOSIS — F332 Major depressive disorder, recurrent severe without psychotic features: Secondary | ICD-10-CM | POA: Diagnosis not present

## 2024-06-27 DIAGNOSIS — F332 Major depressive disorder, recurrent severe without psychotic features: Secondary | ICD-10-CM | POA: Diagnosis not present

## 2024-06-28 DIAGNOSIS — F411 Generalized anxiety disorder: Secondary | ICD-10-CM | POA: Diagnosis not present

## 2024-07-04 DIAGNOSIS — F332 Major depressive disorder, recurrent severe without psychotic features: Secondary | ICD-10-CM | POA: Diagnosis not present

## 2024-07-07 DIAGNOSIS — F331 Major depressive disorder, recurrent, moderate: Secondary | ICD-10-CM | POA: Diagnosis not present

## 2024-07-07 DIAGNOSIS — G4733 Obstructive sleep apnea (adult) (pediatric): Secondary | ICD-10-CM | POA: Diagnosis not present

## 2024-07-07 DIAGNOSIS — G4700 Insomnia, unspecified: Secondary | ICD-10-CM | POA: Diagnosis not present

## 2024-07-07 DIAGNOSIS — F1021 Alcohol dependence, in remission: Secondary | ICD-10-CM | POA: Diagnosis not present

## 2024-07-07 DIAGNOSIS — F419 Anxiety disorder, unspecified: Secondary | ICD-10-CM | POA: Diagnosis not present

## 2024-07-07 DIAGNOSIS — F909 Attention-deficit hyperactivity disorder, unspecified type: Secondary | ICD-10-CM | POA: Diagnosis not present

## 2024-07-11 DIAGNOSIS — F332 Major depressive disorder, recurrent severe without psychotic features: Secondary | ICD-10-CM | POA: Diagnosis not present

## 2024-07-12 DIAGNOSIS — F411 Generalized anxiety disorder: Secondary | ICD-10-CM | POA: Diagnosis not present
